# Patient Record
Sex: Female | Born: 1980 | Race: White | Hispanic: No | Marital: Married | Smoking: Current every day smoker
Health system: Southern US, Community
[De-identification: ages and names within clinical notes are randomized; demographics above are authoritative.]

## PROBLEM LIST (undated history)

## (undated) ENCOUNTER — Inpatient Hospital Stay (HOSPITAL_COMMUNITY): Payer: Self-pay

## (undated) DIAGNOSIS — O139 Gestational [pregnancy-induced] hypertension without significant proteinuria, unspecified trimester: Secondary | ICD-10-CM

## (undated) DIAGNOSIS — D219 Benign neoplasm of connective and other soft tissue, unspecified: Secondary | ICD-10-CM

## (undated) DIAGNOSIS — R400 Somnolence: Secondary | ICD-10-CM

## (undated) DIAGNOSIS — R5381 Other malaise: Secondary | ICD-10-CM

## (undated) DIAGNOSIS — G43909 Migraine, unspecified, not intractable, without status migrainosus: Secondary | ICD-10-CM

## (undated) HISTORY — DX: Somnolence: R40.0

## (undated) HISTORY — PX: CHOLECYSTECTOMY: SHX55

## (undated) HISTORY — DX: Other malaise: R53.81

## (undated) HISTORY — DX: Gestational (pregnancy-induced) hypertension without significant proteinuria, unspecified trimester: O13.9

## (undated) HISTORY — PX: WISDOM TOOTH EXTRACTION: SHX21

---
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
## 2013-12-18 NOTE — L&D Delivery Note (Signed)
Delivery Note At 6:37 PM a viable female was delivered via Vaginal, Spontaneous Delivery (Presentation: ;  ).  APGAR: , ; weight .   Placenta status: , .  Cord:  with the following complications: .  Cord pH: not done  Anesthesia:   Episiotomy:  Lacerations:  Suture Repair: 2.0 Est. Blood Loss (mL):   Mom to postpartum.  Baby to Couplet care / Skin to Skin.  Abdulahi Schor A 08/28/2014, 6:44 PM

## 2014-01-16 ENCOUNTER — Ambulatory Visit (INDEPENDENT_AMBULATORY_CARE_PROVIDER_SITE_OTHER): Payer: PRIVATE HEALTH INSURANCE | Admitting: Obstetrics & Gynecology

## 2014-01-16 ENCOUNTER — Encounter: Payer: Self-pay | Admitting: Obstetrics & Gynecology

## 2014-01-16 VITALS — BP 123/75 | Temp 98.5°F | Wt 201.0 lb

## 2014-01-16 DIAGNOSIS — Z3480 Encounter for supervision of other normal pregnancy, unspecified trimester: Secondary | ICD-10-CM | POA: Insufficient documentation

## 2014-01-16 DIAGNOSIS — Z3201 Encounter for pregnancy test, result positive: Secondary | ICD-10-CM

## 2014-01-16 DIAGNOSIS — N926 Irregular menstruation, unspecified: Secondary | ICD-10-CM

## 2014-01-16 LAB — POCT URINALYSIS DIPSTICK
Bilirubin, UA: NEGATIVE
Blood, UA: NEGATIVE
Glucose, UA: NEGATIVE
KETONES UA: NEGATIVE
Leukocytes, UA: NEGATIVE
Nitrite, UA: NEGATIVE
PH UA: 5
Protein, UA: NEGATIVE
SPEC GRAV UA: 1.015
Urobilinogen, UA: NEGATIVE

## 2014-01-16 LAB — POCT URINE PREGNANCY: Preg Test, Ur: NEGATIVE

## 2014-01-16 NOTE — Progress Notes (Signed)
Pulse 82 Subjective:    Annette Miranda is being seen today for her first obstetrical visit.  This is a planned pregnancy. She is at [redacted]w[redacted]d gestation. Her obstetrical history is significant for none. Relationship with FOB: spouse, living together. Patient unsure intend to breast feed. Pregnancy history fully reviewed.  Menstrual History: OB History   Grav Para Term Preterm Abortions TAB SAB Ect Mult Living   3 2 2       2       Menarche age: 33 Patient's last menstrual period was 12/06/2013.    The following portions of the patient's history were reviewed and updated as appropriate: allergies, current medications, past family history, past medical history, past social history, past surgical history and problem list.  Review of Systems Pertinent items are noted in HPI.    Objective:      General Appearance:    Alert, cooperative, no distress, appears stated age  Head:    Normocephalic, without obvious abnormality, atraumatic  Eyes:    PERRL, conjunctiva/corneas clear, EOM's intact, fundi    benign, both eyes  Ears:    Normal TM's and external ear canals, both ears  Nose:   Nares normal, septum midline, mucosa normal, no drainage    or sinus tenderness  Throat:   Lips, mucosa, and tongue normal; teeth and gums normal  Neck:   Supple, symmetrical, trachea midline, no adenopathy;    thyroid:  no enlargement/tenderness/nodules; no carotid   bruit or JVD  Back:     Symmetric, no curvature, ROM normal, no CVA tenderness  Lungs:     Clear to auscultation bilaterally, respirations unlabored  Chest Wall:    No tenderness or deformity   Heart:    Regular rate and rhythm, S1 and S2 normal, no murmur, rub   or gallop  Breast Exam:    No tenderness, masses, or nipple abnormality  Abdomen:     Soft, non-tender, bowel sounds active all four quadrants,    no masses, no organomegaly  Genitalia:    Normal female without lesion, discharge or tenderness  Extremities:   Extremities normal, atraumatic,  no cyanosis or edema  Pulses:   2+ and symmetric all extremities  Skin:   Skin color, texture, turgor normal, no rashes or lesions  Lymph nodes:   Cervical, supraclavicular, and axillary nodes normal  Neurologic:   CNII-XII intact, normal strength, sensation and reflexes    throughout        Assessment:    Pregnancy at [redacted]w[redacted]d weeks    Plan:    Initial labs drawn. Prenatal vitamins.  Counseling provided regarding continued use of seat belts, cessation of alcohol consumption, smoking or use of illicit drugs; infection precautions i.e., influenza/TDAP immunizations, toxoplasmosis,CMV, parvovirus, listeria and varicella; workplace safety, exercise during pregnancy; routine dental care, safe medications, sexual activity, hot tubs, saunas, pools, travel, caffeine use, fish and methlymercury, potential toxins, hair treatments, varicose veins Weight gain recommendations per IOM guidelines reviewed: overweight/BMI 25 - 29.9--> gain 15 - 25 lbs Problem list reviewed and updated. FIRST/CF mutation testing/NIPT discussed. Role of ultrasound in pregnancy discussed. Amniocentesis discussed: not indicated. Follow up in 6 weeks. 50% of 20 min visit spent on counseling and coordination of care.

## 2014-01-17 LAB — GC/CHLAMYDIA PROBE AMP
CT PROBE, AMP APTIMA: NEGATIVE
GC Probe RNA: NEGATIVE

## 2014-01-19 NOTE — Patient Instructions (Signed)
Pregnancy - First Trimester  During sexual intercourse, millions of sperm go into the vagina. Only 1 sperm will penetrate and fertilize the female egg while it is in the Fallopian tube. One week later, the fertilized egg implants into the wall of the uterus. An embryo begins to develop into a baby. At 6 to 8 weeks, the eyes and face are formed and the heartbeat can be seen on ultrasound. At the end of 12 weeks (first trimester), all the baby's organs are formed. Now that you are pregnant, you will want to do everything you can to have a healthy baby. Two of the most important things are to get good prenatal care and follow your caregiver's instructions. Prenatal care is all the medical care you receive before the baby's birth. It is given to prevent, find, and treat problems during the pregnancy and childbirth.  PRENATAL EXAMS  · During prenatal visits, your weight, blood pressure, and urine are checked. This is done to make sure you are healthy and progressing normally during the pregnancy.  · A pregnant woman should gain 25 to 35 pounds during the pregnancy. However, if you are overweight or underweight, your caregiver will advise you regarding your weight.  · Your caregiver will ask and answer questions for you.  · Blood work, cervical cultures, other necessary tests, and a Pap test are done during your prenatal exams. These tests are done to check on your health and the probable health of your baby. Tests are strongly recommended and done for HIV with your permission. This is the virus that causes AIDS. These tests are done because medicines can be given to help prevent your baby from being born with this infection should you have been infected without knowing it. Blood work is also used to find out your blood type, previous infections, and follow your blood levels (hemoglobin).  · Low hemoglobin (anemia) is common during pregnancy. Iron and vitamins are given to help prevent this. Later in the pregnancy, blood  tests for diabetes will be done along with any other tests if any problems develop.  · You may need other tests to make sure you and the baby are doing well.  CHANGES DURING THE FIRST TRIMESTER   Your body goes through many changes during pregnancy. They vary from person to person. Talk to your caregiver about changes you notice and are concerned about. Changes can include:  · Your menstrual period stops.  · The egg and sperm carry the genes that determine what you look like. Genes from you and your partner are forming a baby. The female genes determine whether the baby is a boy or a girl.  · Your body increases in girth and you may feel bloated.  · Feeling sick to your stomach (nauseous) and throwing up (vomiting). If the vomiting is uncontrollable, call your caregiver.  · Your breasts will begin to enlarge and become tender.  · Your nipples may stick out more and become darker.  · The need to urinate more. Painful urination may mean you have a bladder infection.  · Tiring easily.  · Loss of appetite.  · Cravings for certain kinds of food.  · At first, you may gain or lose a couple of pounds.  · You may have changes in your emotions from day to day (excited to be pregnant or concerned something may go wrong with the pregnancy and baby).  · You may have more vivid and strange dreams.  HOME CARE INSTRUCTIONS   ·   It is very important to avoid all smoking, alcohol and non-prescribed drugs during your pregnancy. These affect the formation and growth of the baby. Avoid chemicals while pregnant to ensure the delivery of a healthy infant.  · Start your prenatal visits by the 12th week of pregnancy. They are usually scheduled monthly at first, then more often in the last 2 months before delivery. Keep your caregiver's appointments. Follow your caregiver's instructions regarding medicine use, blood and lab tests, exercise, and diet.  · During pregnancy, you are providing food for you and your baby. Eat regular, well-balanced  meals. Choose foods such as meat, fish, milk and other low fat dairy products, vegetables, fruits, and whole-grain breads and cereals. Your caregiver will tell you of the ideal weight gain.  · You can help morning sickness by keeping soda crackers at the bedside. Eat a couple before arising in the morning. You may want to use the crackers without salt on them.  · Eating 4 to 5 small meals rather than 3 large meals a day also may help the nausea and vomiting.  · Drinking liquids between meals instead of during meals also seems to help nausea and vomiting.  · A physical sexual relationship may be continued throughout pregnancy if there are no other problems. Problems may be early (premature) leaking of amniotic fluid from the membranes, vaginal bleeding, or belly (abdominal) pain.  · Exercise regularly if there are no restrictions. Check with your caregiver or physical therapist if you are unsure of the safety of some of your exercises. Greater weight gain will occur in the last 2 trimesters of pregnancy. Exercising will help:  · Control your weight.  · Keep you in shape.  · Prepare you for labor and delivery.  · Help you lose your pregnancy weight after you deliver your baby.  · Wear a good support or jogging bra for breast tenderness during pregnancy. This may help if worn during sleep too.  · Ask when prenatal classes are available. Begin classes when they are offered.  · Do not use hot tubs, steam rooms, or saunas.  · Wear your seat belt when driving. This protects you and your baby if you are in an accident.  · Avoid raw meat, uncooked cheese, cat litter boxes, and soil used by cats throughout the pregnancy. These carry germs that can cause birth defects in the baby.  · The first trimester is a good time to visit your dentist for your dental health. Getting your teeth cleaned is okay. Use a softer toothbrush and brush gently during pregnancy.  · Ask for help if you have financial, counseling, or nutritional needs  during pregnancy. Your caregiver will be able to offer counseling for these needs as well as refer you for other special needs.  · Do not take any medicines or herbs unless told by your caregiver.  · Inform your caregiver if there is any mental or physical domestic violence.  · Make a list of emergency phone numbers of family, friends, hospital, and police and fire departments.  · Write down your questions. Take them to your prenatal visit.  · Do not douche.  · Do not cross your legs.  · If you have to stand for long periods of time, rotate you feet or take small steps in a circle.  · You may have more vaginal secretions that may require a sanitary pad. Do not use tampons or scented sanitary pads.  MEDICINES AND DRUG USE IN PREGNANCY  ·   Take prenatal vitamins as directed. The vitamin should contain 1 milligram of folic acid. Keep all vitamins out of reach of children. Only a couple vitamins or tablets containing iron may be fatal to a baby or young child when ingested.  · Avoid use of all medicines, including herbs, over-the-counter medicines, not prescribed or suggested by your caregiver. Only take over-the-counter or prescription medicines for pain, discomfort, or fever as directed by your caregiver. Do not use aspirin, ibuprofen, or naproxen unless directed by your caregiver.  · Let your caregiver also know about herbs you may be using.  · Alcohol is related to a number of birth defects. This includes fetal alcohol syndrome. All alcohol, in any form, should be avoided completely. Smoking will cause low birth rate and premature babies.  · Street or illegal drugs are very harmful to the baby. They are absolutely forbidden. A baby born to an addicted mother will be addicted at birth. The baby will go through the same withdrawal an adult does.  · Let your caregiver know about any medicines that you have to take and for what reason you take them.  SEEK MEDICAL CARE IF:   You have any concerns or worries during your  pregnancy. It is better to call with your questions if you feel they cannot wait, rather than worry about them.  SEEK IMMEDIATE MEDICAL CARE IF:   · An unexplained oral temperature above 102° F (38.9° C) develops, or as your caregiver suggests.  · You have leaking of fluid from the vagina (birth canal). If leaking membranes are suspected, take your temperature and inform your caregiver of this when you call.  · There is vaginal spotting or bleeding. Notify your caregiver of the amount and how many pads are used.  · You develop a bad smelling vaginal discharge with a change in the color.  · You continue to feel sick to your stomach (nauseated) and have no relief from remedies suggested. You vomit blood or coffee ground-like materials.  · You lose more than 2 pounds of weight in 1 week.  · You gain more than 2 pounds of weight in 1 week and you notice swelling of your face, hands, feet, or legs.  · You gain 5 pounds or more in 1 week (even if you do not have swelling of your hands, face, legs, or feet).  · You get exposed to German measles and have never had them.  · You are exposed to fifth disease or chickenpox.  · You develop belly (abdominal) pain. Round ligament discomfort is a common non-cancerous (benign) cause of abdominal pain in pregnancy. Your caregiver still must evaluate this.  · You develop headache, fever, diarrhea, pain with urination, or shortness of breath.  · You fall or are in a car accident or have any kind of trauma.  · There is mental or physical violence in your home.  Document Released: 11/28/2001 Document Revised: 08/28/2012 Document Reviewed: 06/01/2009  [employer]® Patient Information ©2014 [employer].

## 2014-02-24 ENCOUNTER — Ambulatory Visit (INDEPENDENT_AMBULATORY_CARE_PROVIDER_SITE_OTHER): Payer: PRIVATE HEALTH INSURANCE

## 2014-02-24 ENCOUNTER — Encounter: Payer: Self-pay | Admitting: Obstetrics & Gynecology

## 2014-02-24 ENCOUNTER — Other Ambulatory Visit: Payer: Self-pay | Admitting: Obstetrics & Gynecology

## 2014-02-24 DIAGNOSIS — O3660X0 Maternal care for excessive fetal growth, unspecified trimester, not applicable or unspecified: Secondary | ICD-10-CM

## 2014-02-24 DIAGNOSIS — O3680X0 Pregnancy with inconclusive fetal viability, not applicable or unspecified: Secondary | ICD-10-CM

## 2014-02-24 DIAGNOSIS — Z1389 Encounter for screening for other disorder: Secondary | ICD-10-CM

## 2014-02-24 LAB — US OB DETAIL + 14 WK

## 2014-02-26 ENCOUNTER — Encounter: Payer: PRIVATE HEALTH INSURANCE | Admitting: Obstetrics & Gynecology

## 2014-02-26 ENCOUNTER — Ambulatory Visit (INDEPENDENT_AMBULATORY_CARE_PROVIDER_SITE_OTHER): Payer: PRIVATE HEALTH INSURANCE | Admitting: Obstetrics & Gynecology

## 2014-02-26 ENCOUNTER — Encounter: Payer: PRIVATE HEALTH INSURANCE | Admitting: Advanced Practice Midwife

## 2014-02-26 ENCOUNTER — Encounter (HOSPITAL_COMMUNITY): Payer: Self-pay | Admitting: Obstetrics & Gynecology

## 2014-02-26 ENCOUNTER — Other Ambulatory Visit: Payer: Self-pay | Admitting: Obstetrics & Gynecology

## 2014-02-26 VITALS — BP 120/83 | Temp 98.6°F | Wt 221.0 lb

## 2014-02-26 DIAGNOSIS — R0989 Other specified symptoms and signs involving the circulatory and respiratory systems: Secondary | ICD-10-CM

## 2014-02-26 DIAGNOSIS — J329 Chronic sinusitis, unspecified: Secondary | ICD-10-CM

## 2014-02-26 DIAGNOSIS — Z3682 Encounter for antenatal screening for nuchal translucency: Secondary | ICD-10-CM

## 2014-02-26 DIAGNOSIS — Z3480 Encounter for supervision of other normal pregnancy, unspecified trimester: Secondary | ICD-10-CM

## 2014-02-26 LAB — POCT URINALYSIS DIPSTICK
BILIRUBIN UA: NEGATIVE
Glucose, UA: NEGATIVE
Ketones, UA: NEGATIVE
Leukocytes, UA: NEGATIVE
Nitrite, UA: NEGATIVE
PH UA: 5
RBC UA: NEGATIVE
Spec Grav, UA: >=1.03
Urobilinogen, UA: NEGATIVE

## 2014-02-26 MED ORDER — BUDESONIDE 32 MCG/ACT NA SUSP: 1.0000 | Freq: Every day | NASAL | Status: DC

## 2014-02-26 NOTE — Progress Notes (Signed)
Pulse: 84 Patient states she is currently taking a prenatal vitamin but doesn't know the name of it. Patient would like to talk about prenatal vitamins today. Patient states she had some bright red mucous like discharge 3 weeks ago. Patient states she has been having really bad sinuses and getting out of breath really easily. Patient states she is having a lot of sinus pressure and is getting headaches from it.  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

## 2014-02-26 NOTE — Patient Instructions (Signed)
Second Trimester of Pregnancy The second trimester is from week 13 through week 28, months 4 through 6. The second trimester is often a time when you feel your best. Your body has also adjusted to being pregnant, and you begin to feel better physically. Usually, morning sickness has lessened or quit completely, you may have more energy, and you may have an increase in appetite. The second trimester is also a time when the fetus is growing rapidly. At the end of the sixth month, the fetus is about 9 inches long and weighs about 1 pounds. You will likely begin to feel the baby move (quickening) between 18 and 20 weeks of the pregnancy. BODY CHANGES Your body goes through many changes during pregnancy. The changes vary from woman to woman.   Your weight will continue to increase. You will notice your lower abdomen bulging out.  You may begin to get stretch marks on your hips, abdomen, and breasts.  You may develop headaches that can be relieved by medicines approved by your caregiver.  You may urinate more often because the fetus is pressing on your bladder.  You may develop or continue to have heartburn as a result of your pregnancy.  You may develop constipation because certain hormones are causing the muscles that push waste through your intestines to slow down.  You may develop hemorrhoids or swollen, bulging veins (varicose veins).  You may have back pain because of the weight gain and pregnancy hormones relaxing your joints between the bones in your pelvis and as a result of a shift in weight and the muscles that support your balance.  Your breasts will continue to grow and be tender.  Your gums may bleed and may be sensitive to brushing and flossing.  Dark spots or blotches (chloasma, mask of pregnancy) may develop on your face. This will likely fade after the baby is born.  A dark line from your belly button to the pubic area (linea nigra) may appear. This will likely fade after the  baby is born. WHAT TO EXPECT AT YOUR PRENATAL VISITS During a routine prenatal visit:  You will be weighed to make sure you and the fetus are growing normally.  Your blood pressure will be taken.  Your abdomen will be measured to track your baby's growth.  The fetal heartbeat will be listened to.  Any test results from the previous visit will be discussed. Your caregiver may ask you:  How you are feeling.  If you are feeling the baby move.  If you have had any abnormal symptoms, such as leaking fluid, bleeding, severe headaches, or abdominal cramping.  If you have any questions. Other tests that may be performed during your second trimester include:  Blood tests that check for:  Low iron levels (anemia).  Gestational diabetes (between 24 and 28 weeks).  Rh antibodies.  Urine tests to check for infections, diabetes, or protein in the urine.  An ultrasound to confirm the proper growth and development of the baby.  An amniocentesis to check for possible genetic problems.  Fetal screens for spina bifida and Down syndrome. HOME CARE INSTRUCTIONS   Avoid all smoking, herbs, alcohol, and unprescribed drugs. These chemicals affect the formation and growth of the baby.  Follow your caregiver's instructions regarding medicine use. There are medicines that are either safe or unsafe to take during pregnancy.  Exercise only as directed by your caregiver. Experiencing uterine cramps is a good sign to stop exercising.  Continue to eat regular,   healthy meals.  Wear a good support bra for breast tenderness.  Do not use hot tubs, steam rooms, or saunas.  Wear your seat belt at all times when driving.  Avoid raw meat, uncooked cheese, cat litter boxes, and soil used by cats. These carry germs that can cause birth defects in the baby.  Take your prenatal vitamins.  Try taking a stool softener (if your caregiver approves) if you develop constipation. Eat more high-fiber foods,  such as fresh vegetables or fruit and whole grains. Drink plenty of fluids to keep your urine clear or pale yellow.  Take warm sitz baths to soothe any pain or discomfort caused by hemorrhoids. Use hemorrhoid cream if your caregiver approves.  If you develop varicose veins, wear support hose. Elevate your feet for 15 minutes, 3 4 times a day. Limit salt in your diet.  Avoid heavy lifting, wear low heel shoes, and practice good posture.  Rest with your legs elevated if you have leg cramps or low back pain.  Visit your dentist if you have not gone yet during your pregnancy. Use a soft toothbrush to brush your teeth and be gentle when you floss.  A sexual relationship may be continued unless your caregiver directs you otherwise.  Continue to go to all your prenatal visits as directed by your caregiver. SEEK MEDICAL CARE IF:   You have dizziness.  You have mild pelvic cramps, pelvic pressure, or nagging pain in the abdominal area.  You have persistent nausea, vomiting, or diarrhea.  You have a bad smelling vaginal discharge.  You have pain with urination. SEEK IMMEDIATE MEDICAL CARE IF:   You have a fever.  You are leaking fluid from your vagina.  You have spotting or bleeding from your vagina.  You have severe abdominal cramping or pain.  You have rapid weight gain or loss.  You have shortness of breath with chest pain.  You notice sudden or extreme swelling of your face, hands, ankles, feet, or legs.  You have not felt your baby move in over an hour.  You have severe headaches that do not go away with medicine.  You have vision changes. Document Released: 11/28/2001 Document Revised: 08/06/2013 Document Reviewed: 02/04/2013 [employer] Patient Information 2014 [employer].  

## 2014-02-27 LAB — OBSTETRIC PANEL
ANTIBODY SCREEN: NEGATIVE
Basophils Absolute: 0 10*3/uL (ref 0.0–0.1)
Basophils Relative: 0 % (ref 0–1)
EOS ABS: 0.1 10*3/uL (ref 0.0–0.7)
EOS PCT: 1 % (ref 0–5)
HCT: 39.7 % (ref 36.0–46.0)
HEMOGLOBIN: 13.6 g/dL (ref 12.0–15.0)
Hepatitis B Surface Ag: NEGATIVE
LYMPHS ABS: 1.8 10*3/uL (ref 0.7–4.0)
Lymphocytes Relative: 21 % (ref 12–46)
MCH: 31.1 pg (ref 26.0–34.0)
MCHC: 34.3 g/dL (ref 30.0–36.0)
MCV: 90.6 fL (ref 78.0–100.0)
MONOS PCT: 5 % (ref 3–12)
Monocytes Absolute: 0.4 10*3/uL (ref 0.1–1.0)
Neutro Abs: 6.1 10*3/uL (ref 1.7–7.7)
Neutrophils Relative %: 73 % (ref 43–77)
Platelets: 200 10*3/uL (ref 150–400)
RBC: 4.38 MIL/uL (ref 3.87–5.11)
RDW: 13.6 % (ref 11.5–15.5)
RH TYPE: POSITIVE
Rubella: 1.24 Index — ABNORMAL HIGH (ref <0.90)
WBC: 8.4 10*3/uL (ref 4.0–10.5)

## 2014-02-27 LAB — CULTURE, OB URINE: Colony Count: 60000

## 2014-02-27 LAB — HIV ANTIBODY (ROUTINE TESTING W REFLEX): HIV: NONREACTIVE

## 2014-02-27 LAB — VARICELLA ZOSTER ANTIBODY, IGG: VARICELLA IGG: 722.6 {index} — AB (ref <135.00)

## 2014-02-27 LAB — VITAMIN D 25 HYDROXY (VIT D DEFICIENCY, FRACTURES): VIT D 25 HYDROXY: 40 ng/mL (ref 30–89)

## 2014-03-02 LAB — HEMOGLOBINOPATHY EVALUATION
HEMOGLOBIN OTHER: 0 %
Hgb A2 Quant: 2.7 % (ref 2.2–3.2)
Hgb A: 97.3 % (ref 96.8–97.8)
Hgb F Quant: 0 % (ref 0.0–2.0)
Hgb S Quant: 0 %

## 2014-03-04 ENCOUNTER — Encounter: Payer: Self-pay | Admitting: Obstetrics & Gynecology

## 2014-03-04 ENCOUNTER — Encounter (HOSPITAL_COMMUNITY): Payer: Self-pay

## 2014-03-04 ENCOUNTER — Ambulatory Visit (HOSPITAL_COMMUNITY)
Admission: RE | Admit: 2014-03-04 | Discharge: 2014-03-04 | Disposition: A | Discharge status: Home / Self Care | Payer: PRIVATE HEALTH INSURANCE | Source: Ambulatory Visit | Attending: Obstetrics & Gynecology | Admitting: Obstetrics & Gynecology

## 2014-03-04 DIAGNOSIS — Z3682 Encounter for antenatal screening for nuchal translucency: Secondary | ICD-10-CM

## 2014-03-04 DIAGNOSIS — Z3689 Encounter for other specified antenatal screening: Secondary | ICD-10-CM | POA: Insufficient documentation

## 2014-03-04 DIAGNOSIS — O3510X0 Maternal care for (suspected) chromosomal abnormality in fetus, unspecified, not applicable or unspecified: Secondary | ICD-10-CM | POA: Insufficient documentation

## 2014-03-04 DIAGNOSIS — O351XX0 Maternal care for (suspected) chromosomal abnormality in fetus, not applicable or unspecified: Secondary | ICD-10-CM | POA: Insufficient documentation

## 2014-03-04 IMAGING — US US MFM FETAL NUCHAL TRANSLUCENCY
1 series · 13 of 25 positions shown · non-contrast
Comparison: none

[Series 1: us mfm fetal nuchal translucency · 0.20mm/px · 13 of 25 slices shown]
[im 1/25]
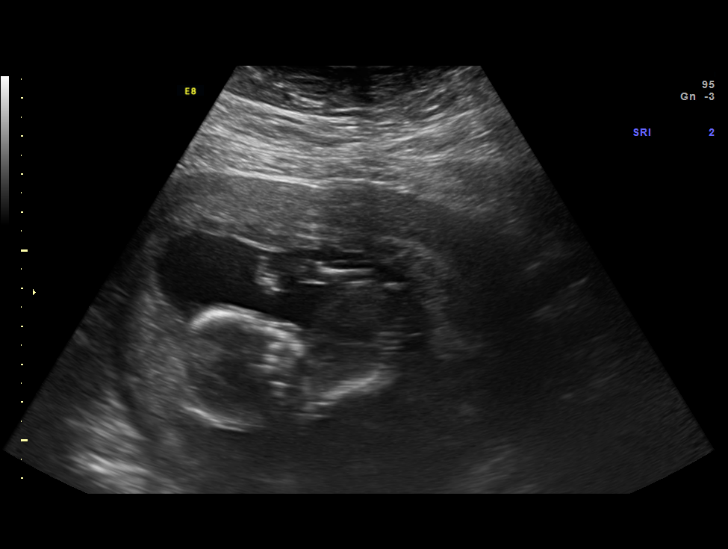
[im 3/25]
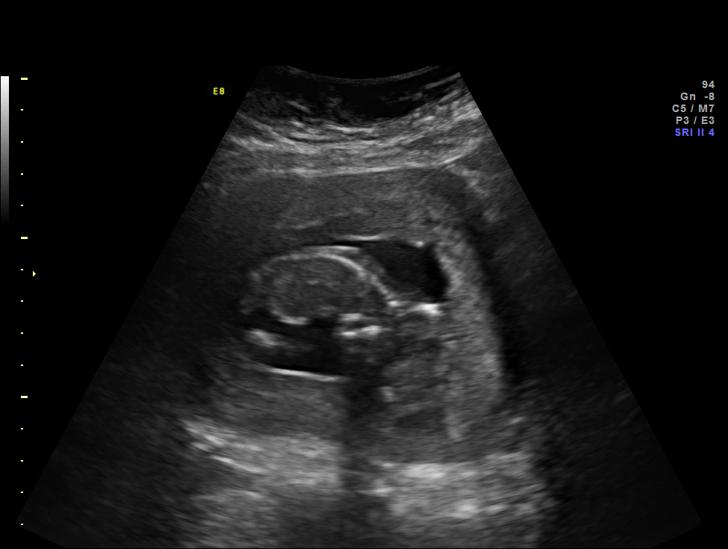
[im 5/25]
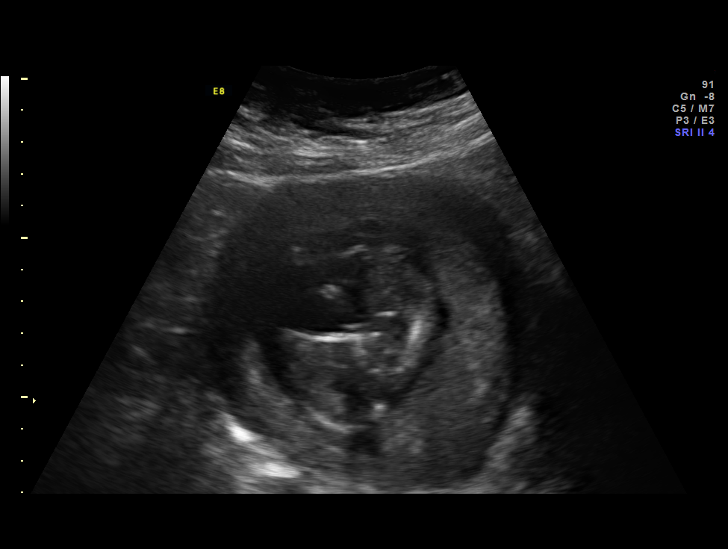
[im 7/25]
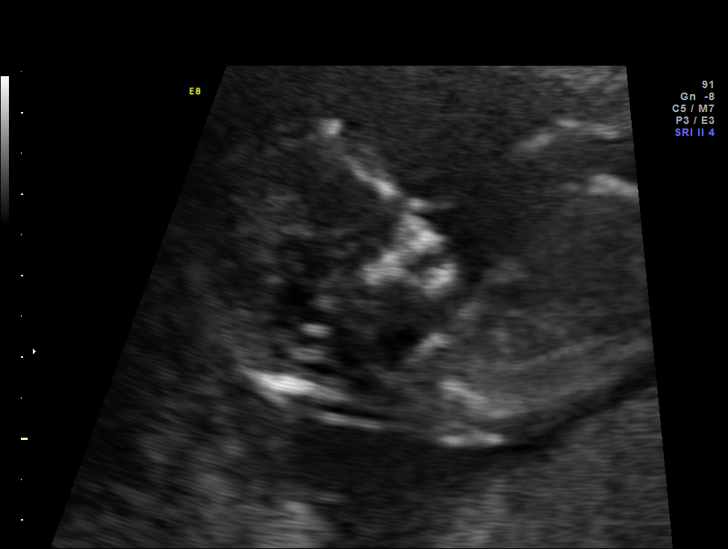
[im 9/25]
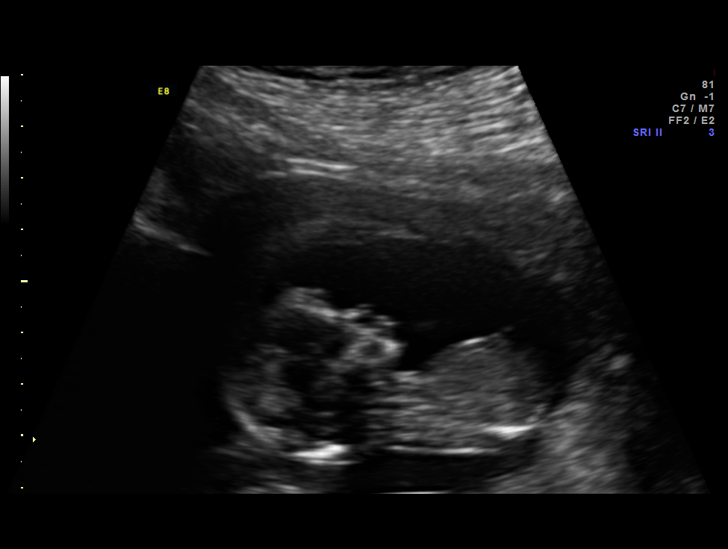
[im 11/25]
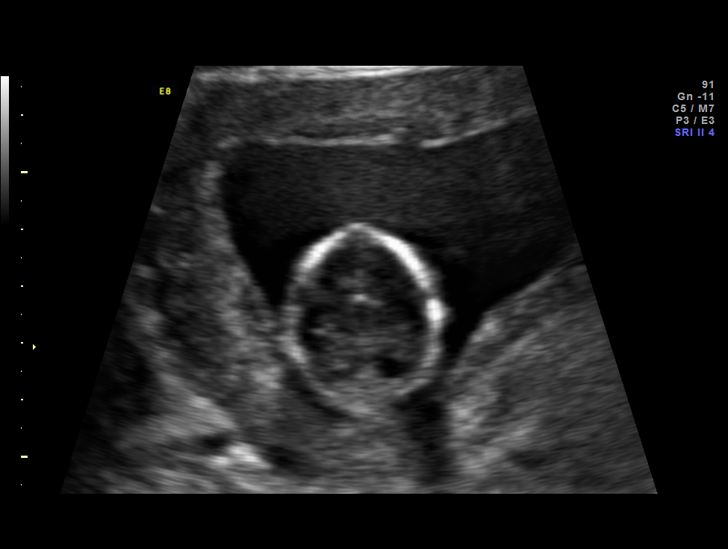
[im 13/25]
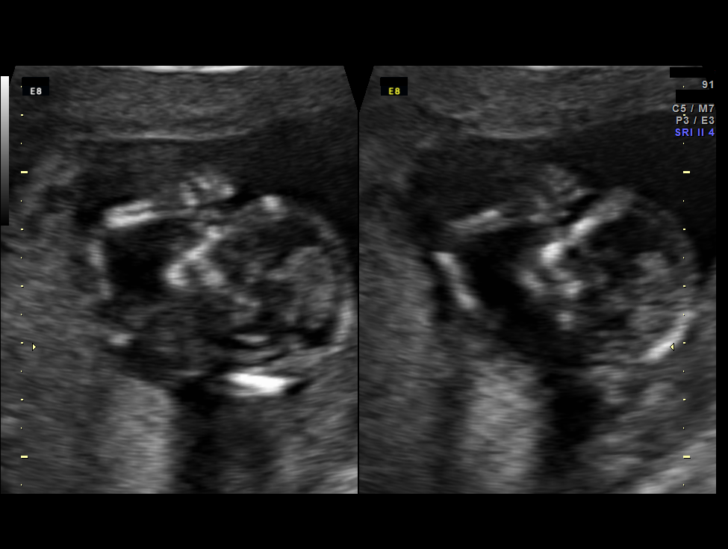
[im 15/25]
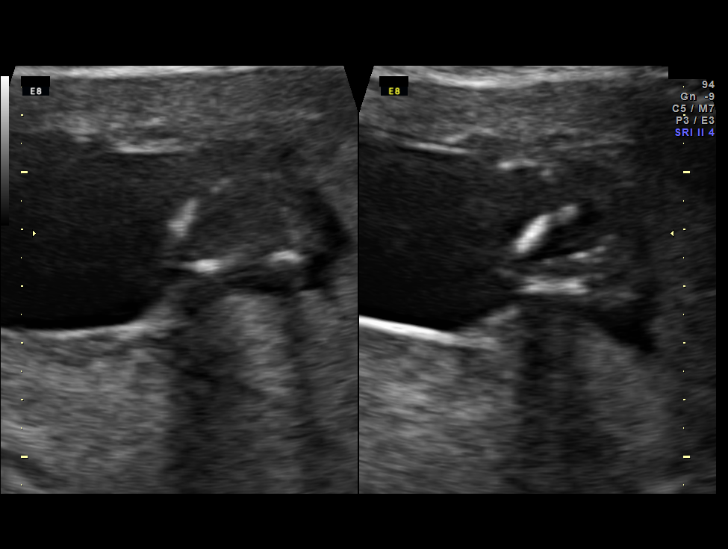
[im 17/25]
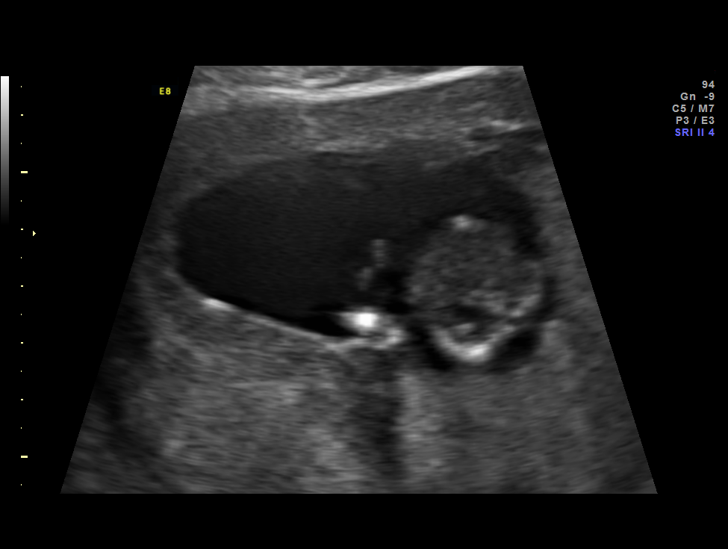
[im 19/25]
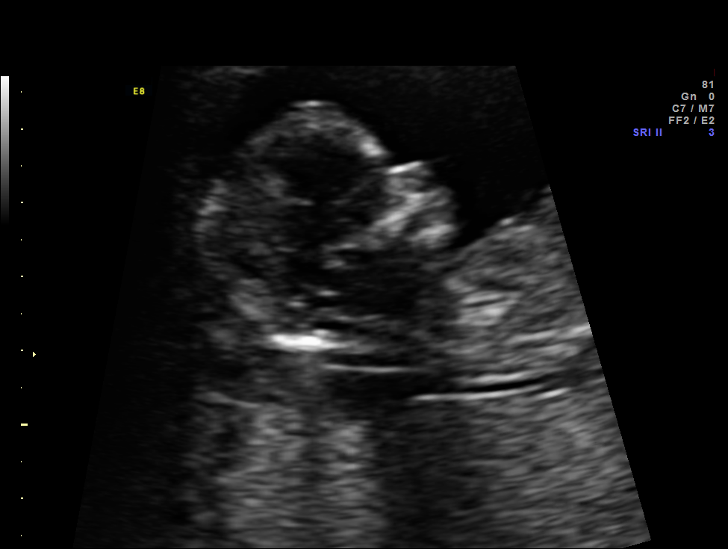
[im 21/25]
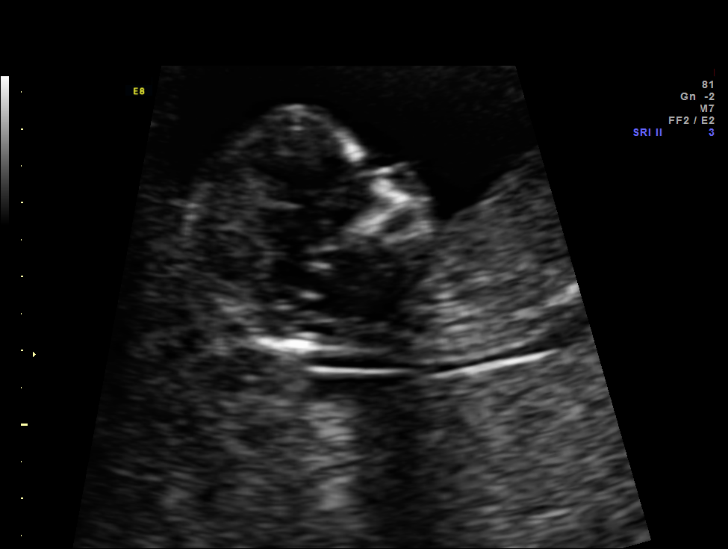
[im 23/25]
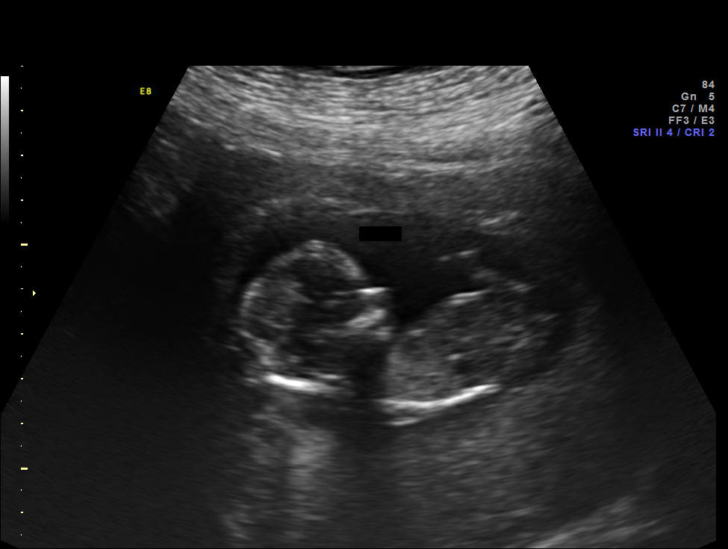
[im 25/25]
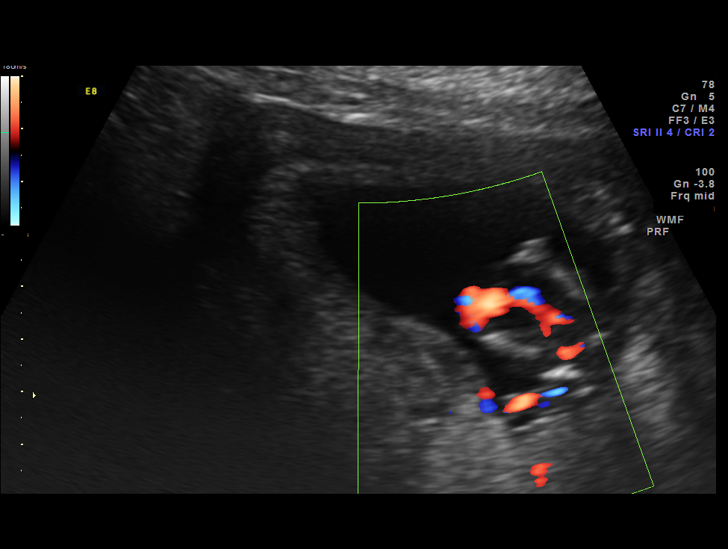

[13 of 25 positions shown; findings below may reference images not displayed]

OBSTETRICS REPORT
                      (Signed Final 03/04/2014 [DATE])

Service(s) Provided

Indications

 First trimester aneuploidy screen (NT)
Fetal Evaluation

 Num Of Fetuses:    1
 Fetal Heart Rate:  161                          bpm
 Cardiac Activity:  Observed
 Presentation:      Transverse, head to
                    maternal right

 Amniotic Fluid
 AFI FV:      Subjectively within normal limits
Biometry

 CRL:     72.5  mm     G. Age:  13w 1d                 EDD:    09/08/14

 NT:       2.2  mm
Gestational Age

 Best:          13w 5d     Det. By:  Early Ultrasound         EDD:   09/04/14
                                     (02/24/14)
Anatomy

 Choroid Plexus:   Appears normal         Lower             Visualized
                                          Extremities:
 Stomach:          Appears normal, left   Upper             Visualized
                   sided                  Extremities:
 Cord Vessels:     Appears normal (3
                   vessel cord)
Cervix Uterus Adnexa

 Cervix:       Normal appearance by transabdominal scan.
Impression

 Single IuP at 13 [DATE] weeks
 NT 1.2 mm; Nasal bone visualized
 First trimester aneuploidy screen performed as noted above.
Recommendations

 Please do not draw triple/quad screen, though patient should
 be offered MSAFP for neural tube defect screening.
 Recommend ultrasound for anatomy at 18-20 weeks.

 questions or concerns.

## 2014-03-07 ENCOUNTER — Encounter: Payer: Self-pay | Admitting: Obstetrics & Gynecology

## 2014-03-11 ENCOUNTER — Encounter: Payer: Self-pay | Admitting: Obstetrics & Gynecology

## 2014-03-30 ENCOUNTER — Encounter: Payer: PRIVATE HEALTH INSURANCE | Admitting: Obstetrics & Gynecology

## 2014-04-13 ENCOUNTER — Encounter: Payer: Self-pay | Admitting: Obstetrics & Gynecology

## 2014-04-13 ENCOUNTER — Ambulatory Visit (INDEPENDENT_AMBULATORY_CARE_PROVIDER_SITE_OTHER): Payer: PRIVATE HEALTH INSURANCE | Admitting: Obstetrics & Gynecology

## 2014-04-13 VITALS — BP 131/79 | HR 92 | Temp 98.1°F | Wt 238.0 lb

## 2014-04-13 DIAGNOSIS — Z3480 Encounter for supervision of other normal pregnancy, unspecified trimester: Secondary | ICD-10-CM

## 2014-04-13 DIAGNOSIS — O99330 Smoking (tobacco) complicating pregnancy, unspecified trimester: Secondary | ICD-10-CM

## 2014-04-13 DIAGNOSIS — IMO0002 Reserved for concepts with insufficient information to code with codable children: Secondary | ICD-10-CM

## 2014-04-13 LAB — POCT URINALYSIS DIPSTICK
Bilirubin, UA: NEGATIVE
Blood, UA: NEGATIVE
KETONES UA: NEGATIVE
LEUKOCYTES UA: NEGATIVE
Nitrite, UA: NEGATIVE
Protein, UA: NEGATIVE
SPEC GRAV UA: 1.02
Urobilinogen, UA: NEGATIVE
pH, UA: 5

## 2014-04-13 MED ORDER — CITRANATAL HARMONY 27-1-250 MG PO CAPS: 1.0000 | ORAL_CAPSULE | Freq: Every day | ORAL | Status: DC

## 2014-04-13 NOTE — Patient Instructions (Signed)
Alpha-Fetoprotein Alpha-Fetoprotein (AFP) is a protein made by the fetal liver. It is normally elevated in the newborn and the mother. In the infant, the value falls to adult values (normally less than 20 ng/ml (nanograms per milliliter) by one year of age. This protein is found in a number of abnormal tumors and conditions. It can be used for a screening test when this is appropriate. PREPARATION FOR TEST No preparation or fasting is required. ELEVATIONS OF AFP ARE CAUSED BY:  Primary hepatocellular carcinoma (cancer of the liver) and the level correlates with tumor size.  A screening test for embryonic teratocarcinomas, hepatoblastomas (tumor of the liver).  Rarely hepatic metastases (cancer spread) from the GI tract.  Some cholangiocarcinomas cause elevations greater than 400 ng/ml.  Rapidly progressing hepatitis can produce levels of AFP in excess of 1000ng/ml.  Lesser levels of hepatitis (inflammation of the liver) can produce levels of 100 to 400 ng/ml. NORMAL FINDINGS   Adult: Less than 40 ng/mL or Less than 40 mg/L (SI units).  Child younger than 1 year: Less than 30ng/mL. Ranges are stratified by weeks of gestation and vary among laboratories. Ranges for normal findings may vary among different laboratories and hospitals. You should always check with your doctor after having lab work or other tests done to discuss the meaning of your test results and whether your values are considered within normal limits. MEANING OF TEST  Your caregiver will go over the test results with you and discuss the importance and meaning of your results, as well as treatment options and the need for additional tests if necessary. OBTAINING THE TEST RESULTS  It is your responsibility to obtain your test results. Ask the lab or department performing the test when and how you will get your results. Document Released: 12/28/2004 Document Revised: 02/26/2012 Document Reviewed: 11/08/2008 [facility] Patient  Information 2014 [place], [place].

## 2014-04-13 NOTE — Progress Notes (Signed)
Subjective:    Annette Miranda is a 33 y.o. female being seen today for her obstetrical visit. She is at [redacted]w[redacted]d gestation. Patient reports: nausea and vomiting . Fetal movement: normal.  Problem List Items Addressed This Visit   Supervision of other normal pregnancy - Primary   Relevant Medications      Prenat w/o A-FeCbn-DSS-FA-DHA (CITRANATAL HARMONY) 27-1-250 MG CAPS   Other Relevant Orders      POCT urinalysis dipstick (Completed)      Alpha fetoprotein, maternal   Tobacco smoking complicating pregnancy     Patient Active Problem List   Diagnosis Date Noted  . Tobacco smoking complicating pregnancy 64/40/3474  . 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 Supervision of other normal pregnancy 01/16/2014   Objective:    BP 131/79  Pulse 92  Temp(Src) 98.1 F (36.7 C)  Wt 107.956 kg (238 lb)  LMP 12/06/2013 FHT: 140 BPM  Uterine Size: @ Umbilicus     Assessment:    Pregnancy @ [redacted]w[redacted]d   Elevated B/P today Plan:  Recheck B/P--> 131/79 Orders Placed This Encounter  Procedures  . Alpha fetoprotein, maternal    Order Specific Question:  Gest Age at U/S (Wk.Dy)    Answer:  13.1    Order Specific Question:  Maternal Weight (lbs)    Answer:  5    Order Specific Question:  Maternal Race    Answer:  caucasian    Order Specific Question:  Maternal IDDM (insulin-dependent diabetes mellitus)    Answer:  No    Order Specific Question:  Number of Fetuses    Answer:  1    Order Specific Question:  Date of LMP?    Answer:  12/06/2013    Order Specific Question:  Ultrasound Date    Answer:  03/04/2014    Order Specific Question:  Hx of OSB/NTD?    Answer:  No    Order Specific Question:  EDD    Answer:  09/04/2014    Order Specific Question:  Repeat Sample    Answer:  No    Order Specific Question:  Brief History NTD    Answer:  none    Order Specific Question:  History of Down Syndrome?    Answer:  No    Order Specific Question:  Pregnancy Donor Egg (Y/N)    Answer:  No  .  POCT urinalysis dipstick  U/S for anatomy Labs, problem list reviewed and updated Follow up in 4 weeks.

## 2014-04-14 LAB — ALPHA FETOPROTEIN, MATERNAL
AFP: 49 IU/mL
Curr Gest Age: 18.6 wks.days
MOM FOR AFP: 1.51
Open Spina bifida: NEGATIVE

## 2014-04-21 ENCOUNTER — Other Ambulatory Visit: Payer: Self-pay | Admitting: Obstetrics & Gynecology

## 2014-04-21 ENCOUNTER — Ambulatory Visit (INDEPENDENT_AMBULATORY_CARE_PROVIDER_SITE_OTHER): Payer: PRIVATE HEALTH INSURANCE

## 2014-04-21 DIAGNOSIS — Z1389 Encounter for screening for other disorder: Secondary | ICD-10-CM

## 2014-04-22 ENCOUNTER — Encounter: Payer: Self-pay | Admitting: Obstetrics & Gynecology

## 2014-04-22 LAB — US OB LIMITED

## 2014-04-23 ENCOUNTER — Encounter: Payer: Self-pay | Admitting: Obstetrics & Gynecology

## 2014-05-06 ENCOUNTER — Inpatient Hospital Stay (HOSPITAL_COMMUNITY)
Admission: AD | Admit: 2014-05-06 | Discharge: 2014-05-06 | Disposition: A | Discharge status: Home / Self Care | Payer: PRIVATE HEALTH INSURANCE | Source: Ambulatory Visit | Attending: Obstetrics & Gynecology | Admitting: Obstetrics & Gynecology

## 2014-05-06 ENCOUNTER — Inpatient Hospital Stay (HOSPITAL_COMMUNITY): Payer: PRIVATE HEALTH INSURANCE

## 2014-05-06 ENCOUNTER — Encounter (HOSPITAL_COMMUNITY): Payer: Self-pay

## 2014-05-06 DIAGNOSIS — M545 Low back pain, unspecified: Secondary | ICD-10-CM | POA: Insufficient documentation

## 2014-05-06 DIAGNOSIS — O209 Hemorrhage in early pregnancy, unspecified: Secondary | ICD-10-CM | POA: Insufficient documentation

## 2014-05-06 DIAGNOSIS — O4692 Antepartum hemorrhage, unspecified, second trimester: Secondary | ICD-10-CM

## 2014-05-06 DIAGNOSIS — IMO0002 Reserved for concepts with insufficient information to code with codable children: Secondary | ICD-10-CM

## 2014-05-06 DIAGNOSIS — Z87891 Personal history of nicotine dependence: Secondary | ICD-10-CM | POA: Insufficient documentation

## 2014-05-06 LAB — OB RESULTS CONSOLE GC/CHLAMYDIA
CHLAMYDIA, DNA PROBE: NEGATIVE
Gonorrhea: NEGATIVE

## 2014-05-06 LAB — URINALYSIS, ROUTINE W REFLEX MICROSCOPIC
Bilirubin Urine: NEGATIVE
GLUCOSE, UA: NEGATIVE mg/dL
Ketones, ur: 15 mg/dL — AB
Leukocytes, UA: NEGATIVE
Nitrite: NEGATIVE
PH: 6 (ref 5.0–8.0)
Protein, ur: NEGATIVE mg/dL
Specific Gravity, Urine: >1.03 — ABNORMAL HIGH (ref 1.005–1.030)
Urobilinogen, UA: 0.2 mg/dL (ref 0.0–1.0)

## 2014-05-06 LAB — WET PREP, GENITAL
CLUE CELLS WET PREP: NONE SEEN
Trich, Wet Prep: NONE SEEN
Yeast Wet Prep HPF POC: NONE SEEN

## 2014-05-06 LAB — CBC
HCT: 35.6 % — ABNORMAL LOW (ref 36.0–46.0)
Hemoglobin: 12.6 g/dL (ref 12.0–15.0)
MCH: 32.7 pg (ref 26.0–34.0)
MCHC: 35.4 g/dL (ref 30.0–36.0)
MCV: 92.5 fL (ref 78.0–100.0)
Platelets: 162 10*3/uL (ref 150–400)
RBC: 3.85 MIL/uL — ABNORMAL LOW (ref 3.87–5.11)
RDW: 13 % (ref 11.5–15.5)
WBC: 10 10*3/uL (ref 4.0–10.5)

## 2014-05-06 LAB — URINE MICROSCOPIC-ADD ON

## 2014-05-06 IMAGING — US US OB LIMITED
1 series · 13 of 27 positions shown · non-contrast
Comparison: none

[Series 1: us ob transvaginal · 27 acquisitions, 13 frames shown]
[im 2/27]
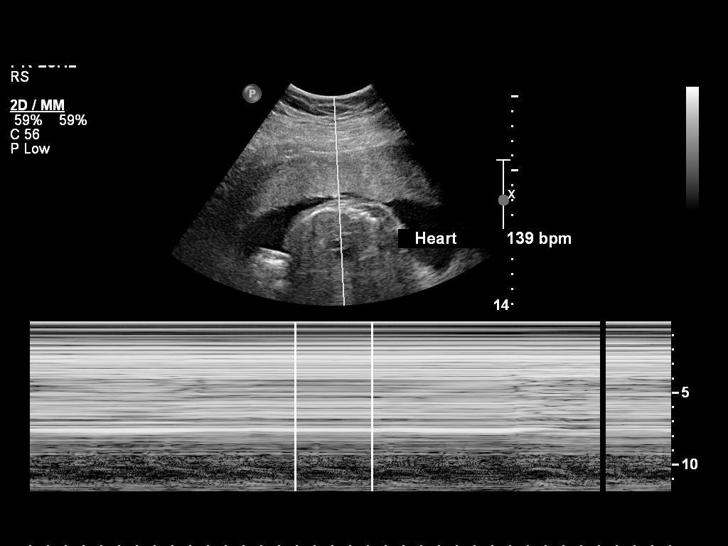
[im 4/27]
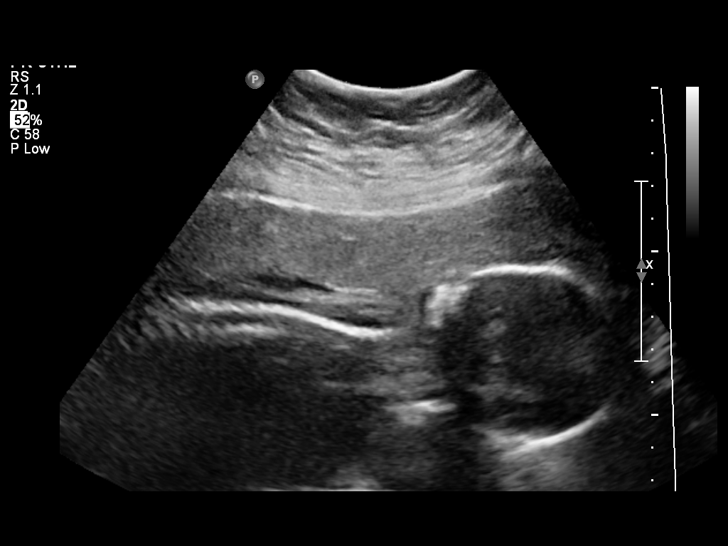
[im 6/27]
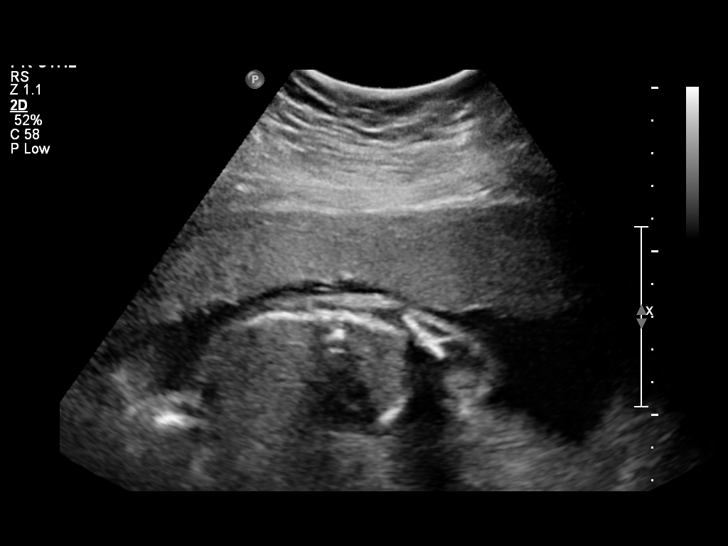
[im 8/27]
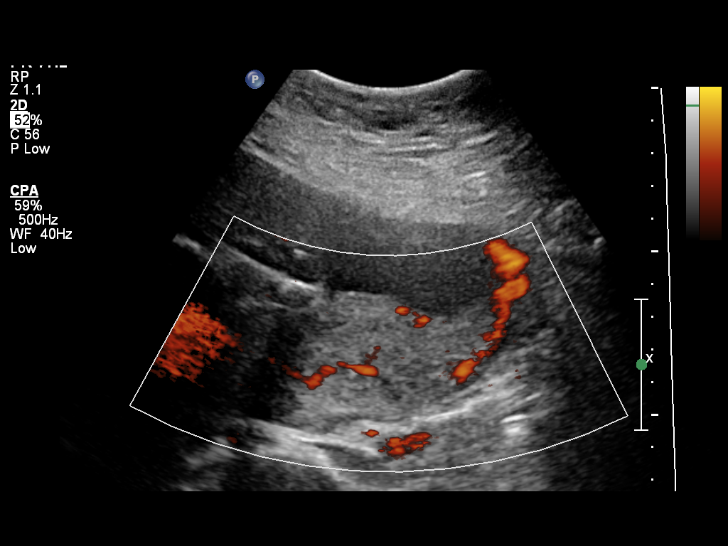
[im 10/27]
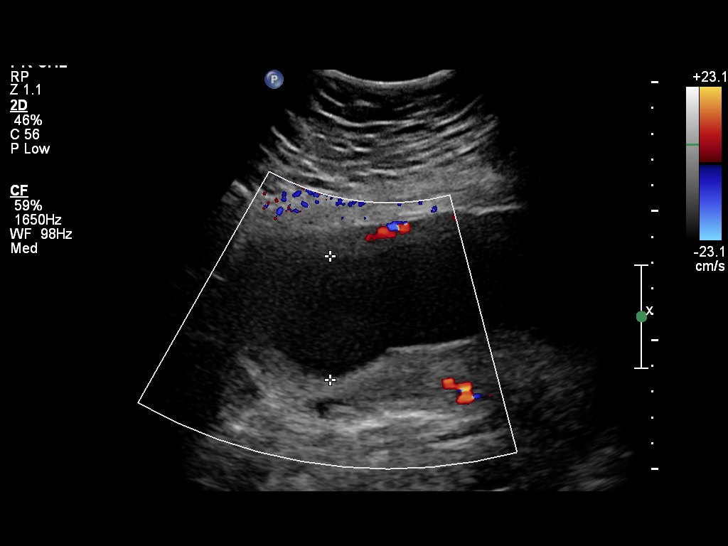
[im 12/27]
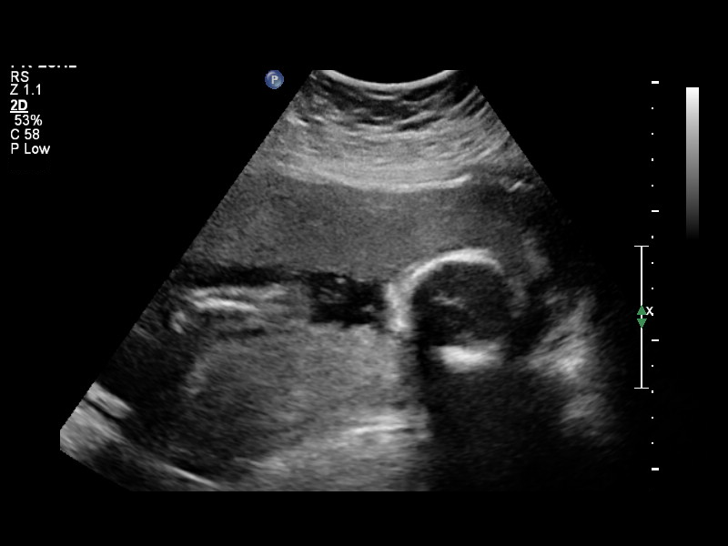
[im 14/27]
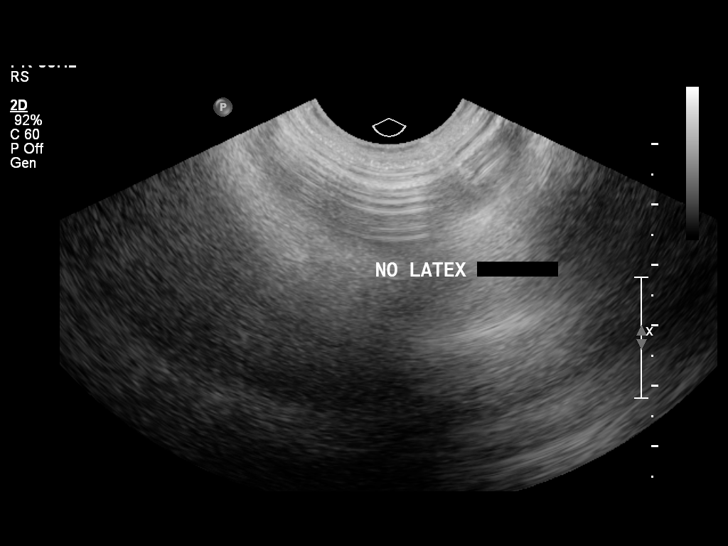
[im 16/27]
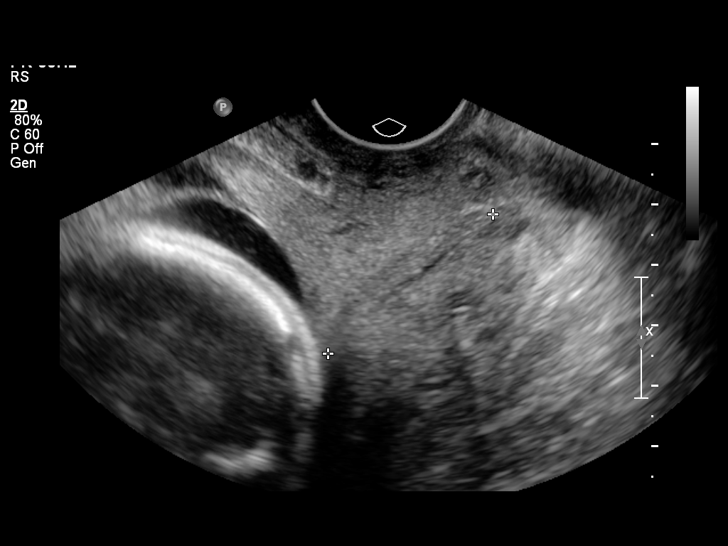
[im 18/27]
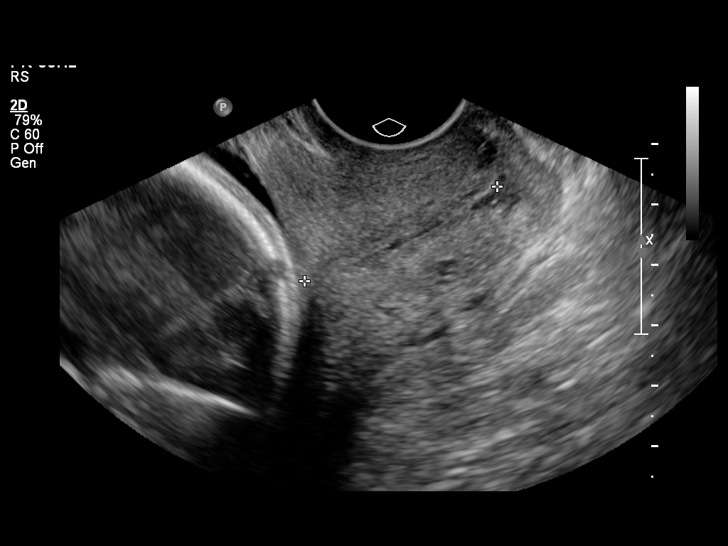
[im 20/27]
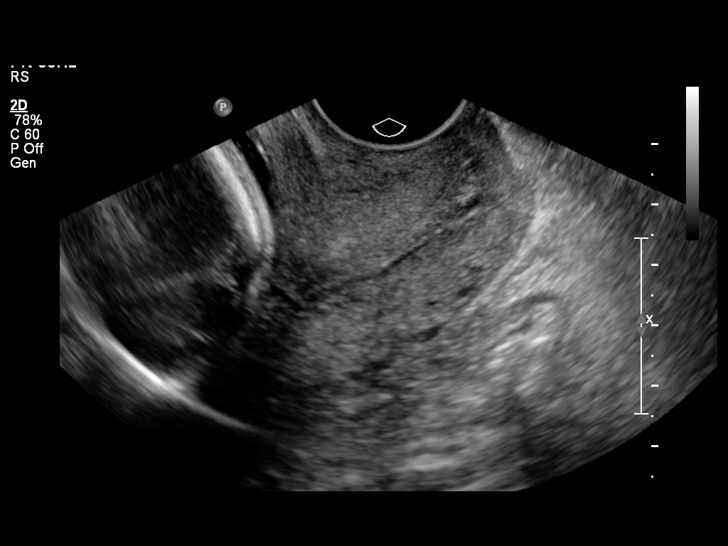
[im 22/27]
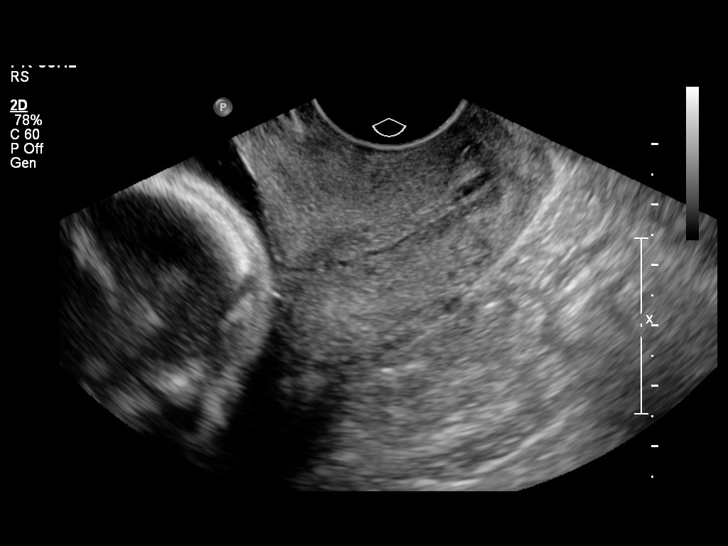
[im 24/27]
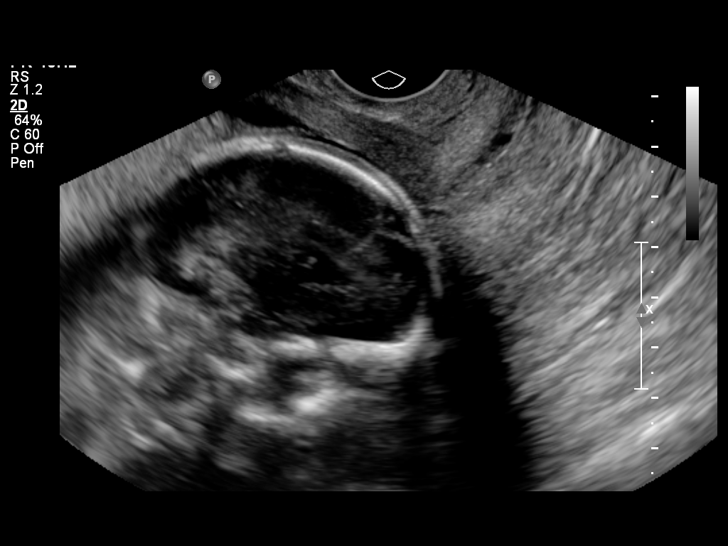
[im 26/27]
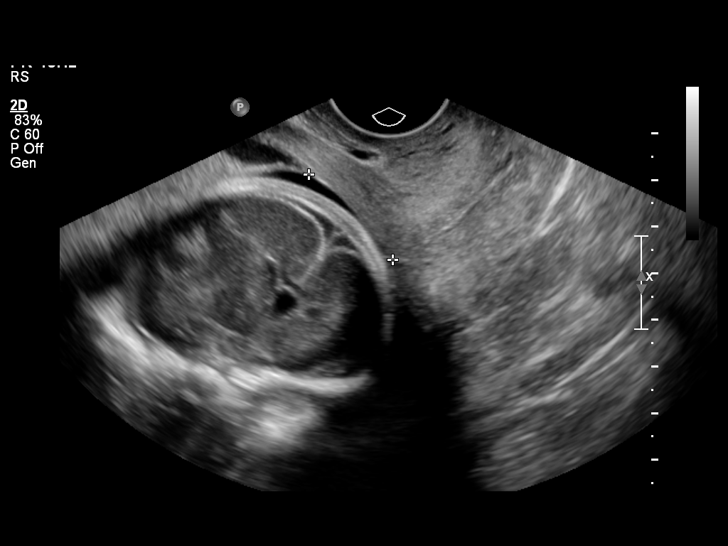

[13 of 27 positions shown; findings below may reference images not displayed]

OBSTETRICS REPORT
                      (Signed Final 05/06/2014 [DATE])

Service(s) Provided

 [HOSPITAL]                                         76815.0
 US MFM OB TRANSVAGINAL                                76817.2
Indications

 Vaginal bleeding, unknown etiology
 Assess cervical length
 Assess amniotic fluid volume
Fetal Evaluation

 Num Of Fetuses:    1
 Fetal Heart Rate:  139                          bpm
 Cardiac Activity:  Observed
 Presentation:      Cephalic
 Placenta:          Anterior ? succienturiate
                    lobe

 Amniotic Fluid
 AFI FV:      Subjectively low-normal
                                             Larg Pckt:     4.8  cm
Gestational Age

 Best:          22w 5d     Det. By:  Early Ultrasound         EDD:   09/04/14
                                     (02/24/14)
Cervix Uterus Adnexa

 Cervical Length:    3.6      cm

 Cervix:       Normal appearance by transvaginal scan
Impression

 Single IUP at 22w 5d
 Limited ultrasound performed due to vaginal bleeding
 An anterior placenta was noted.  On some images, there
 appears to be a succenturiate lobe, but it does not abut the
 cervix
 No subchorionic fluid collections noted

 TVUS - cervical length 3.6 cm.  The leading edge of the
 placenta is 2.6 cm from the internal os.
Recommendations

 Please schedule an ultrasound for fetal anatomy is not
 already performed
 Otherwise, follow up ultrasounds as clinically indicated

 questions or concerns.

## 2014-05-06 NOTE — MAU Provider Note (Signed)
Chief Complaint:  Vaginal Bleeding   First Provider Initiated Contact with Patient 05/06/14 1329      HPI: Annette Miranda is a 33 y.o. G3P2002 at [redacted]w[redacted]d who presents to maternity admissions reporting awakening this morning  noting dark brown blood in which soaked through her underwear and pajamas. She wore a mini pad since then and has had continued brown light spotting. Associated with mild low back pain but no abdominal pain. Previous bleeding episode at 10 weeks. Denies dysuria, hematuria, frequency or urgency of urination. Last intercourse 3 days ago. No antecedent trauma. Denies contractions, leakage of fluid or vaginal bleeding. Good fetal movement.   Pregnancy Course: Normal outside [place] on 04/22/14. A pos. Quit smoking first tri.  Past Medical History: History reviewed. No pertinent past medical history.  Past obstetric history: OB History  Gravida Para Term Preterm AB SAB TAB Ectopic Multiple Living  3 2 2       2     # Outcome Date GA Lbr Len/2nd Weight Sex Delivery Anes PTL Lv  3 CUR           2 TRM 2003    F SVD   Y  1 TRM 2002    F SVD   Y      Past Surgical History: Past Surgical History  Procedure Laterality Date  . Cholecystectomy       Family History: Family History  Problem Relation Age of Onset  . Hypertension Mother   . Hypertension Father   . Hypertension Maternal Grandmother   . Cancer Maternal Grandmother   . Hypertension Maternal Grandfather   . Hypertension Paternal Grandmother   . Hypertension Paternal Grandfather   . Cancer Paternal Grandfather     Social History: History  Substance Use Topics  . Smoking status: Former Smoker    Types: Cigarettes    Quit date: 02/13/2014  . Smokeless tobacco: Not on file     Comment: trying to quit  . Alcohol Use: No    Allergies: No Known Allergies  Meds:  Prescriptions prior to admission  Medication Sig Dispense Refill  . fluticasone (FLONASE) 50 MCG/ACT nasal spray Place 1 spray into both nostrils  daily.       . Prenatal Vit-Fe Fumarate-FA (PRENATAL MULTIVITAMIN) TABS tablet Take 1 tablet by mouth daily at 12 noon.        ROS: Pertinent findings in history of present illness.  Physical Exam  Blood pressure 116/73, pulse 95, temperature 99 F (37.2 C), temperature source Oral, resp. rate 20, last menstrual period 12/06/2013. GENERAL: Well-developed, well-nourished female in no acute distress.  HEENT: normocephalic HEART: normal rate RESP: normal effort ABDOMEN: Soft, non-tender, gravid appropriate for gestational age. FHR 150 EXTREMITIES: Nontender, no edema NEURO: alert and oriented SPECULUM EXAM: NEFG, mod amount dark brown thin blood swabbed from cx, cervix clean without apparent active bleeding Cx: L/C        Labs: Results for orders placed during the hospital encounter of 05/06/14 (from the past 24 hour(s))  URINALYSIS, ROUTINE W REFLEX MICROSCOPIC     Status: Abnormal   Collection Time    05/06/14 11:50 AM      Result Value Ref Range   Color, Urine YELLOW  YELLOW   APPearance CLEAR  CLEAR   Specific Gravity, Urine >1.030 (*) 1.005 - 1.030   pH 6.0  5.0 - 8.0   Glucose, UA NEGATIVE  NEGATIVE mg/dL   Hgb urine dipstick LARGE (*) NEGATIVE   Bilirubin Urine NEGATIVE  NEGATIVE   Ketones, ur 15 (*) NEGATIVE mg/dL   Protein, ur NEGATIVE  NEGATIVE mg/dL   Urobilinogen, UA 0.2  0.0 - 1.0 mg/dL   Nitrite NEGATIVE  NEGATIVE   Leukocytes, UA NEGATIVE  NEGATIVE  URINE MICROSCOPIC-ADD ON     Status: Abnormal   Collection Time    05/06/14 11:50 AM      Result Value Ref Range   Squamous Epithelial / LPF FEW (*) RARE   RBC / HPF 0-2  <3 RBC/hpf   Bacteria, UA RARE  RARE  WET PREP, GENITAL     Status: Abnormal   Collection Time    05/06/14  1:57 PM      Result Value Ref Range   Yeast Wet Prep HPF POC NONE SEEN  NONE SEEN   Trich, Wet Prep NONE SEEN  NONE SEEN   Clue Cells Wet Prep HPF POC NONE SEEN  NONE SEEN   WBC, Wet Prep HPF POC FEW (*) NONE SEEN    Imaging:    MAU Course: GC/CT sent C/W Dr. Delsa Sale home with precautions and F/U in office Friday  Assessment: 1. Antepartum bleeding, second trimester   G3P2002 at [redacted]w[redacted]d with scant VB.   Plan: Discharge home with advice to return for any BRB or watery discharge Labor precautions and fetal kick counts    Medication List         fluticasone 50 MCG/ACT nasal spray  Commonly known as:  FLONASE  Place 1 spray into both nostrils daily.     prenatal multivitamin Tabs tablet  Take 1 tablet by mouth daily at 12 noon.       Follow-up Information   Follow up with [facility]. (Keep your scheduled prenatal appointment)    Contact information:   [address] [place] [address] [phone]      Schedule an appointment as soon as possible for a visit in 3 days to follow up.      Lorene Dy, CNM 05/06/2014 1:30 PM

## 2014-05-06 NOTE — Discharge Instructions (Signed)
Pelvic Rest °Pelvic rest is sometimes recommended for women when:  °· The placenta is partially or completely covering the opening of the cervix (placenta previa). °· There is bleeding between the uterine wall and the amniotic sac in the first trimester (subchorionic hemorrhage). °· The cervix begins to open without labor starting (incompetent cervix, cervical insufficiency). °· The labor is too early (preterm labor). °HOME CARE INSTRUCTIONS °· Do not have sexual intercourse, stimulation, or an orgasm. °· Do not use tampons, douche, or put anything in the vagina. °· Do not lift anything over 10 pounds (4.5 kg). °· Avoid strenuous activity or straining your pelvic muscles. °SEEK MEDICAL CARE IF:  °· You have any vaginal bleeding during pregnancy. Treat this as a potential emergency. °· You have cramping pain felt low in the stomach (stronger than menstrual cramps). °· You notice vaginal discharge (watery, mucus, or bloody). °· You have a low, dull backache. °· There are regular contractions or uterine tightening. °SEEK IMMEDIATE MEDICAL CARE IF: °You have vaginal bleeding and have placenta previa.  °Document Released: 03/31/2011 Document Revised: 02/26/2012 Document Reviewed: 03/31/2011 °ExitCare® Patient Information ©2014 [employer]. ° °

## 2014-05-06 NOTE — MAU Note (Signed)
Pt sates that she had some dark red bleeding this morning along with some back pain and her doctor told her to come to the hospital.

## 2014-05-06 NOTE — MAU Note (Signed)
Brownish red d/c noted in underwear and pj bottoms this morning, has continued, small amt on pad (per pt).  Had bleeding at 10 wks., not since.

## 2014-05-07 ENCOUNTER — Encounter: Payer: Self-pay | Admitting: Obstetrics & Gynecology

## 2014-05-07 ENCOUNTER — Ambulatory Visit (INDEPENDENT_AMBULATORY_CARE_PROVIDER_SITE_OTHER): Payer: PRIVATE HEALTH INSURANCE | Admitting: Obstetrics & Gynecology

## 2014-05-07 VITALS — BP 131/86 | HR 80 | Temp 98.3°F | Wt 237.0 lb

## 2014-05-07 DIAGNOSIS — O4690 Antepartum hemorrhage, unspecified, unspecified trimester: Secondary | ICD-10-CM

## 2014-05-07 DIAGNOSIS — O4692 Antepartum hemorrhage, unspecified, second trimester: Secondary | ICD-10-CM

## 2014-05-07 DIAGNOSIS — Z3480 Encounter for supervision of other normal pregnancy, unspecified trimester: Secondary | ICD-10-CM

## 2014-05-07 LAB — GC/CHLAMYDIA PROBE AMP
CT PROBE, AMP APTIMA: NEGATIVE
GC PROBE AMP APTIMA: NEGATIVE

## 2014-05-07 LAB — POCT URINALYSIS DIPSTICK
Glucose, UA: NEGATIVE
Ketones, UA: NEGATIVE
Leukocytes, UA: NEGATIVE
NITRITE UA: NEGATIVE
PROTEIN UA: NEGATIVE
pH, UA: 8.5

## 2014-05-07 NOTE — Progress Notes (Signed)
Subjective:    Chrystie Hagwood is a 33 y.o. female being seen today for her obstetrical visit. She is at [redacted]w[redacted]d  gestation. Patient reports: brown discharge . Fetal movement: normal.  Problem List Items Addressed This Visit   Supervision of other normal pregnancy - Primary   Relevant Orders      POCT urinalysis dipstick (Completed)      Fibrinogen (Completed)     Patient Active Problem List   Diagnosis Date Noted  . Tobacco smoking complicating pregnancy 23/76/2831  . 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 Supervision of other normal pregnancy 01/16/2014   Objective:    BP 131/86  Pulse 80  Temp(Src) 98.3 F (36.8 C)  Wt 107.502 kg (237 lb)  LMP 12/06/2013 FHT: 160 BPM  Uterine Size: Above Umbilicus     Assessment:    Pregnancy @ [redacted]w[redacted]d Second trimester bleeding--?marginal separation from accessory placental lobe, smoker    Plan:   Orders Placed This Encounter  Procedures  . Fibrinogen  . POCT urinalysis dipstick  U/S for growth 2hr GTT next visit Pelvic rest/avoid strenuous activity Labs, problem list reviewed and updated Follow up in 1week.

## 2014-05-08 ENCOUNTER — Other Ambulatory Visit: Payer: Self-pay | Admitting: Obstetrics & Gynecology

## 2014-05-08 DIAGNOSIS — Z1389 Encounter for screening for other disorder: Secondary | ICD-10-CM

## 2014-05-08 DIAGNOSIS — Z363 Encounter for antenatal screening for malformations: Secondary | ICD-10-CM

## 2014-05-08 LAB — FIBRINOGEN: FIBRINOGEN: 278 mg/dL (ref 204–475)

## 2014-05-09 DIAGNOSIS — O4692 Antepartum hemorrhage, unspecified, second trimester: Secondary | ICD-10-CM | POA: Insufficient documentation

## 2014-05-09 NOTE — Patient Instructions (Signed)
Vaginal Bleeding During Pregnancy, Second Trimester °A small amount of bleeding (spotting) from the vagina is relatively common in pregnancy. It usually stops on its own. Various things can cause bleeding or spotting in pregnancy. Some bleeding may be related to the pregnancy, and some may not. Sometimes the bleeding is normal and is not a problem. However, bleeding can also be a sign of something serious. Be sure to tell your health care provider about any vaginal bleeding right away. °Some possible causes of vaginal bleeding during the second trimester include: °· Infection, inflammation, or growths on the cervix.   °· The placenta may be partially or completely covering the opening of the cervix inside the uterus (placenta previa). °· The placenta may have separated from the uterus (abruption of the placenta).   °· You may be having early (preterm) labor.   °· The cervix may not be strong enough to keep a baby inside the uterus (cervical insufficiency).   °· Tiny cysts may have developed in the uterus instead of pregnancy tissue (molar pregnancy).  °HOME CARE INSTRUCTIONS  °Watch your condition for any changes. The following actions may help to lessen any discomfort you are feeling: °· Follow your health care provider's instructions for limiting your activity. If your health care provider orders bed rest, you may need to stay in bed and only get up to use the bathroom. However, your health care provider may allow you to continue light activity. °· If needed, make plans for someone to help with your regular activities and responsibilities while you are on bed rest. °· Keep track of the number of pads you use each day, how often you change pads, and how soaked (saturated) they are. Write this down. °· Do not use tampons. Do not douche. °· Do not have sexual intercourse or orgasms until approved by your health care provider. °· If you pass any tissue from your vagina, save the tissue so you can show it to your  health care provider. °· Only take over-the-counter or prescription medicines as directed by your health care provider. °· Do not take aspirin because it can make you bleed. °· Do not exercise or perform any strenuous activities or heavy lifting without your health care provider's permission. °· Keep all follow-up appointments as directed by your health care provider. °SEEK MEDICAL CARE IF: °· You have any vaginal bleeding during any part of your pregnancy. °· You have cramps or labor pains. °SEEK IMMEDIATE MEDICAL CARE IF:  °· You have severe cramps in your back or belly (abdomen). °· You have contractions. °· You have a fever, not controlled by medicine. °· You have chills. °· You pass large clots or tissue from your vagina. °· Your bleeding increases. °· You feel lightheaded or weak, or you have fainting episodes. °· You are leaking fluid or have a gush of fluid from your vagina. °MAKE SURE YOU: °· Understand these instructions. °· Will watch your condition. °· Will get help right away if you are not doing well or get worse. °Document Released: 09/13/2005 Document Revised: 09/24/2013 Document Reviewed: 08/11/2013 °ExitCare® Patient Information ©2014 [employer]. ° °

## 2014-05-13 ENCOUNTER — Ambulatory Visit (INDEPENDENT_AMBULATORY_CARE_PROVIDER_SITE_OTHER): Payer: PRIVATE HEALTH INSURANCE

## 2014-05-13 ENCOUNTER — Encounter: Payer: Self-pay | Admitting: Obstetrics & Gynecology

## 2014-05-13 ENCOUNTER — Ambulatory Visit (INDEPENDENT_AMBULATORY_CARE_PROVIDER_SITE_OTHER): Payer: PRIVATE HEALTH INSURANCE | Admitting: Obstetrics & Gynecology

## 2014-05-13 VITALS — BP 130/82 | HR 82 | Temp 99.1°F | Wt 248.0 lb

## 2014-05-13 DIAGNOSIS — Z1389 Encounter for screening for other disorder: Secondary | ICD-10-CM

## 2014-05-13 DIAGNOSIS — Z3480 Encounter for supervision of other normal pregnancy, unspecified trimester: Secondary | ICD-10-CM

## 2014-05-13 LAB — POCT URINALYSIS DIPSTICK
Bilirubin, UA: NEGATIVE
Blood, UA: NEGATIVE
Glucose, UA: NEGATIVE
KETONES UA: NEGATIVE
LEUKOCYTES UA: NEGATIVE
Nitrite, UA: NEGATIVE
PROTEIN UA: NEGATIVE
SPEC GRAV UA: 1.01
Urobilinogen, UA: NEGATIVE
pH, UA: 7

## 2014-05-13 LAB — US OB FOLLOW UP

## 2014-05-13 NOTE — Progress Notes (Signed)
Patient reports she is doing better than last week. Patient had [place] today.

## 2014-05-14 ENCOUNTER — Telehealth: Payer: Self-pay | Admitting: *Deleted

## 2014-05-14 NOTE — Telephone Encounter (Signed)
Call was made to office in regards to Annette Miranda referral for MFM consult.  Rosa states that they are in need of more information regarding pt need for MFM consult.  Rosa would like a phone call placed to her if needed or can fax over information they are needing to number below. Contact numbers: Phone [phone] Fax     [phone]

## 2014-05-15 NOTE — Patient Instructions (Signed)
Glucose Tolerance Test This is a test to see how your body processes carbohydrates. This test is often done to check patients for diabetes or the possibility of developing it. PREPARATION FOR TEST You should have nothing to eat or drink 12 hours before the test. You will be given a form of sugar (glucose) and then blood samples will be drawn from your vein to determine the level of sugar in your blood. Alternatively, blood may be drawn from your finger for testing. You should not smoke or exercise during the test. NORMAL FINDINGS  Fasting: 70-115 mg/dL  30 minutes: less than 200 mg/dL  1 hour: less than 200 mg/dL  2 hours: less than 140 mg/dL  3 hours: 70-115 mg/dL  4 hours: 70-115 mg/dL Ranges for normal findings may vary among different laboratories and hospitals. You should always check with your doctor after having lab work or other tests done to discuss the meaning of your test results and whether your values are considered within normal limits. MEANING OF TEST Your caregiver will go over the test results with you and discuss the importance and meaning of your results, as well as treatment options and the need for additional tests. OBTAINING THE TEST RESULTS It is your responsibility to obtain your test results. Ask the lab or department performing the test when and how you will get your results. Document Released: 12/27/2004 Document Revised: 02/26/2012 Document Reviewed: 11/14/2008 ExitCare Patient Information 2014 [employer].  

## 2014-05-15 NOTE — Progress Notes (Signed)
Subjective:    Annette Miranda is a 33 y.o. female being seen today for her obstetrical visit. She is at [redacted]w[redacted]d   gestation. Patient reports: no complaints . Fetal movement: normal.  Problem List Items Addressed This Visit   Supervision of other normal pregnancy - Primary   Relevant Orders      POCT urinalysis dipstick (Completed)     Patient Active Problem List   Diagnosis Date Noted  . Second trimester bleeding 05/09/2014  . Tobacco smoking complicating pregnancy 38/88/2800  . 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 Supervision of other normal pregnancy 01/16/2014   Objective:    BP 130/82  Pulse 82  Temp(Src) 99.1 F (37.3 C)  Wt 112.492 kg (248 lb)  LMP 12/06/2013 FHT: 160 BPM  Uterine Size: Above Umbilicus     Assessment:    Pregnancy @ [redacted]w[redacted]d Second trimester bleeding resolved  Plan:   Orders Placed This Encounter  Procedures  . POCT urinalysis dipstick   2hr GTT next visit Pelvic rest/avoid strenuous activity Labs, problem list reviewed and updated Follow up in 2 weeks.

## 2014-05-19 ENCOUNTER — Other Ambulatory Visit: Payer: Self-pay | Admitting: *Deleted

## 2014-05-19 DIAGNOSIS — O4692 Antepartum hemorrhage, unspecified, second trimester: Secondary | ICD-10-CM

## 2014-05-19 NOTE — Telephone Encounter (Signed)
Upon reviewing chart there is no order seen for pt referral with [facility] MFM.  Dr Delsa Sale can you please review and/or enter order in chart so that we may get appropriate paperwork to [facility].

## 2014-05-20 ENCOUNTER — Other Ambulatory Visit: Payer: Self-pay | Admitting: Obstetrics & Gynecology

## 2014-05-20 ENCOUNTER — Ambulatory Visit (INDEPENDENT_AMBULATORY_CARE_PROVIDER_SITE_OTHER): Payer: PRIVATE HEALTH INSURANCE | Admitting: Obstetrics & Gynecology

## 2014-05-20 ENCOUNTER — Other Ambulatory Visit: Payer: PRIVATE HEALTH INSURANCE

## 2014-05-20 ENCOUNTER — Encounter: Payer: Self-pay | Admitting: Obstetrics & Gynecology

## 2014-05-20 VITALS — BP 110/73 | HR 83 | Temp 98.2°F | Wt 248.0 lb

## 2014-05-20 DIAGNOSIS — Z3480 Encounter for supervision of other normal pregnancy, unspecified trimester: Secondary | ICD-10-CM

## 2014-05-20 DIAGNOSIS — Z1389 Encounter for screening for other disorder: Secondary | ICD-10-CM

## 2014-05-20 LAB — POCT URINALYSIS DIPSTICK
Blood, UA: NEGATIVE
Glucose, UA: NEGATIVE
Ketones, UA: NEGATIVE
LEUKOCYTES UA: NEGATIVE
NITRITE UA: NEGATIVE
PH UA: 5
Protein, UA: NEGATIVE
Spec Grav, UA: 1.015

## 2014-05-20 LAB — CBC
HCT: 38.4 % (ref 36.0–46.0)
Hemoglobin: 13.1 g/dL (ref 12.0–15.0)
MCH: 31.8 pg (ref 26.0–34.0)
MCHC: 34.1 g/dL (ref 30.0–36.0)
MCV: 93.2 fL (ref 78.0–100.0)
PLATELETS: 179 10*3/uL (ref 150–400)
RBC: 4.12 MIL/uL (ref 3.87–5.11)
RDW: 13.1 % (ref 11.5–15.5)
WBC: 9.2 10*3/uL (ref 4.0–10.5)

## 2014-05-20 NOTE — Telephone Encounter (Signed)
Pt is being referred-->MFM at [facility]

## 2014-05-20 NOTE — Progress Notes (Signed)
Subjective:    Annette Miranda is a 33 y.o. female being seen today for her obstetrical visit. She is at [redacted]w[redacted]d  gestation. Patient reports: no complaints . Fetal movement: normal.  Problem List Items Addressed This Visit   Supervision of other normal pregnancy - Primary   Relevant Orders      POCT urinalysis dipstick (Completed)     Patient Active Problem List   Diagnosis Date Noted  . Second trimester bleeding 05/09/2014  . Tobacco smoking complicating pregnancy 62/37/6283  . 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 Supervision of other normal pregnancy 01/16/2014   Objective:    LMP 12/06/2013 FHT: 160 BPM  Uterine Size: Above Umbilicus     Assessment:    Pregnancy @ [redacted]w[redacted]d Second trimester bleeding resolved  Plan:   Orders Placed This Encounter  Procedures  . POCT urinalysis dipstick    Pelvic rest/avoid strenuous activity Labs, problem list reviewed and updated Follow up in 2 weeks.

## 2014-05-20 NOTE — Patient Instructions (Signed)
Second Trimester of Pregnancy The second trimester is from week 13 through week 28, months 4 through 6. The second trimester is often a time when you feel your best. Your body has also adjusted to being pregnant, and you begin to feel better physically. Usually, morning sickness has lessened or quit completely, you may have more energy, and you may have an increase in appetite. The second trimester is also a time when the fetus is growing rapidly. At the end of the sixth month, the fetus is about 9 inches long and weighs about 1 pounds. You will likely begin to feel the baby move (quickening) between 18 and 20 weeks of the pregnancy. BODY CHANGES Your body goes through many changes during pregnancy. The changes vary from woman to woman.   Your weight will continue to increase. You will notice your lower abdomen bulging out.  You may begin to get stretch marks on your hips, abdomen, and breasts.  You may develop headaches that can be relieved by medicines approved by your caregiver.  You may urinate more often because the fetus is pressing on your bladder.  You may develop or continue to have heartburn as a result of your pregnancy.  You may develop constipation because certain hormones are causing the muscles that push waste through your intestines to slow down.  You may develop hemorrhoids or swollen, bulging veins (varicose veins).  You may have back pain because of the weight gain and pregnancy hormones relaxing your joints between the bones in your pelvis and as a result of a shift in weight and the muscles that support your balance.  Your breasts will continue to grow and be tender.  Your gums may bleed and may be sensitive to brushing and flossing.  Dark spots or blotches (chloasma, mask of pregnancy) may develop on your face. This will likely fade after the baby is born.  A dark line from your belly button to the pubic area (linea nigra) may appear. This will likely fade after the  baby is born. WHAT TO EXPECT AT YOUR PRENATAL VISITS During a routine prenatal visit:  You will be weighed to make sure you and the fetus are growing normally.  Your blood pressure will be taken.  Your abdomen will be measured to track your baby's growth.  The fetal heartbeat will be listened to.  Any test results from the previous visit will be discussed. Your caregiver may ask you:  How you are feeling.  If you are feeling the baby move.  If you have had any abnormal symptoms, such as leaking fluid, bleeding, severe headaches, or abdominal cramping.  If you have any questions. Other tests that may be performed during your second trimester include:  Blood tests that check for:  Low iron levels (anemia).  Gestational diabetes (between 24 and 28 weeks).  Rh antibodies.  Urine tests to check for infections, diabetes, or protein in the urine.  An ultrasound to confirm the proper growth and development of the baby.  An amniocentesis to check for possible genetic problems.  Fetal screens for spina bifida and Down syndrome. HOME CARE INSTRUCTIONS   Avoid all smoking, herbs, alcohol, and unprescribed drugs. These chemicals affect the formation and growth of the baby.  Follow your caregiver's instructions regarding medicine use. There are medicines that are either safe or unsafe to take during pregnancy.  Exercise only as directed by your caregiver. Experiencing uterine cramps is a good sign to stop exercising.  Continue to eat regular,   healthy meals.  Wear a good support bra for breast tenderness.  Do not use hot tubs, steam rooms, or saunas.  Wear your seat belt at all times when driving.  Avoid raw meat, uncooked cheese, cat litter boxes, and soil used by cats. These carry germs that can cause birth defects in the baby.  Take your prenatal vitamins.  Try taking a stool softener (if your caregiver approves) if you develop constipation. Eat more high-fiber foods,  such as fresh vegetables or fruit and whole grains. Drink plenty of fluids to keep your urine clear or pale yellow.  Take warm sitz baths to soothe any pain or discomfort caused by hemorrhoids. Use hemorrhoid cream if your caregiver approves.  If you develop varicose veins, wear support hose. Elevate your feet for 15 minutes, 3 4 times a day. Limit salt in your diet.  Avoid heavy lifting, wear low heel shoes, and practice good posture.  Rest with your legs elevated if you have leg cramps or low back pain.  Visit your dentist if you have not gone yet during your pregnancy. Use a soft toothbrush to brush your teeth and be gentle when you floss.  A sexual relationship may be continued unless your caregiver directs you otherwise.  Continue to go to all your prenatal visits as directed by your caregiver. SEEK MEDICAL CARE IF:   You have dizziness.  You have mild pelvic cramps, pelvic pressure, or nagging pain in the abdominal area.  You have persistent nausea, vomiting, or diarrhea.  You have a bad smelling vaginal discharge.  You have pain with urination. SEEK IMMEDIATE MEDICAL CARE IF:   You have a fever.  You are leaking fluid from your vagina.  You have spotting or bleeding from your vagina.  You have severe abdominal cramping or pain.  You have rapid weight gain or loss.  You have shortness of breath with chest pain.  You notice sudden or extreme swelling of your face, hands, ankles, feet, or legs.  You have not felt your baby move in over an hour.  You have severe headaches that do not go away with medicine.  You have vision changes. Document Released: 11/28/2001 Document Revised: 08/06/2013 Document Reviewed: 02/04/2013 [employer] Patient Information 2014 [employer].  

## 2014-05-20 NOTE — Addendum Note (Signed)
Addended by: Lewie Loron D on: 05/20/2014 12:31 PM   Modules accepted: Orders

## 2014-05-21 ENCOUNTER — Encounter (HOSPITAL_COMMUNITY): Payer: Self-pay | Admitting: Obstetrics & Gynecology

## 2014-05-21 LAB — GLUCOSE TOLERANCE, 2 HOURS W/ 1HR
GLUCOSE, FASTING: 69 mg/dL — AB (ref 70–99)
Glucose, 1 hour: 106 mg/dL (ref 70–170)
Glucose, 2 hour: 92 mg/dL (ref 70–139)

## 2014-05-21 LAB — RPR

## 2014-05-21 LAB — HIV ANTIBODY (ROUTINE TESTING W REFLEX): HIV 1&2 Ab, 4th Generation: NONREACTIVE

## 2014-05-26 NOTE — Telephone Encounter (Signed)
[id number] - brm - mfm scheduled

## 2014-06-01 ENCOUNTER — Ambulatory Visit (HOSPITAL_COMMUNITY)
Admission: RE | Admit: 2014-06-01 | Discharge: 2014-06-01 | Disposition: A | Discharge status: Home / Self Care | Payer: PRIVATE HEALTH INSURANCE | Source: Ambulatory Visit | Attending: Obstetrics & Gynecology | Admitting: Obstetrics & Gynecology

## 2014-06-01 VITALS — BP 141/76 | HR 97

## 2014-06-01 DIAGNOSIS — Z3480 Encounter for supervision of other normal pregnancy, unspecified trimester: Secondary | ICD-10-CM

## 2014-06-01 DIAGNOSIS — O4692 Antepartum hemorrhage, unspecified, second trimester: Secondary | ICD-10-CM

## 2014-06-01 DIAGNOSIS — O99330 Smoking (tobacco) complicating pregnancy, unspecified trimester: Secondary | ICD-10-CM

## 2014-06-01 DIAGNOSIS — J329 Chronic sinusitis, unspecified: Secondary | ICD-10-CM

## 2014-06-01 DIAGNOSIS — Z363 Encounter for antenatal screening for malformations: Secondary | ICD-10-CM | POA: Insufficient documentation

## 2014-06-01 DIAGNOSIS — Z1389 Encounter for screening for other disorder: Secondary | ICD-10-CM

## 2014-06-01 IMAGING — US US OB DETAIL+14 WK
1 series · 12 of 28 positions shown · non-contrast
Comparison: none

[Series 1: us ob detail+14 wk · 0.23mm/px · 99 acquisitions, 12 frames shown]
[im 4/99]
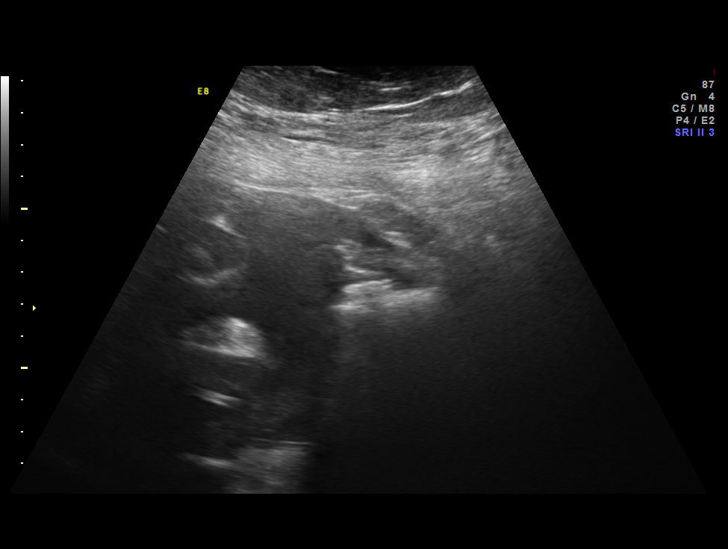
[im 11/99]
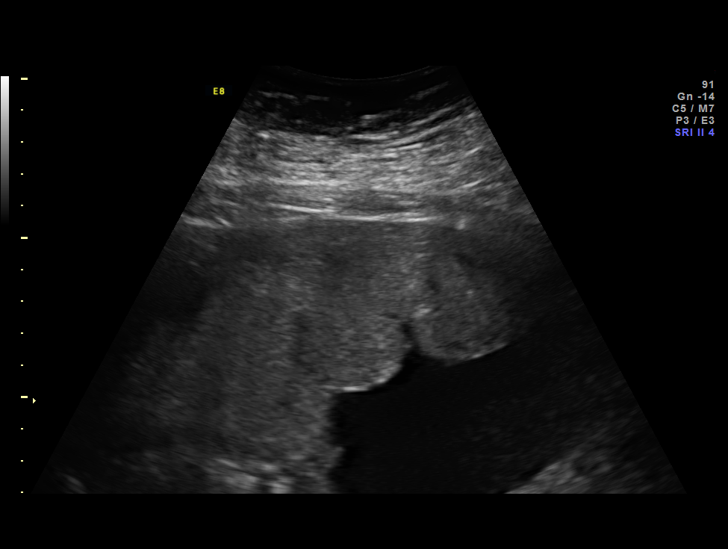
[im 19/99]
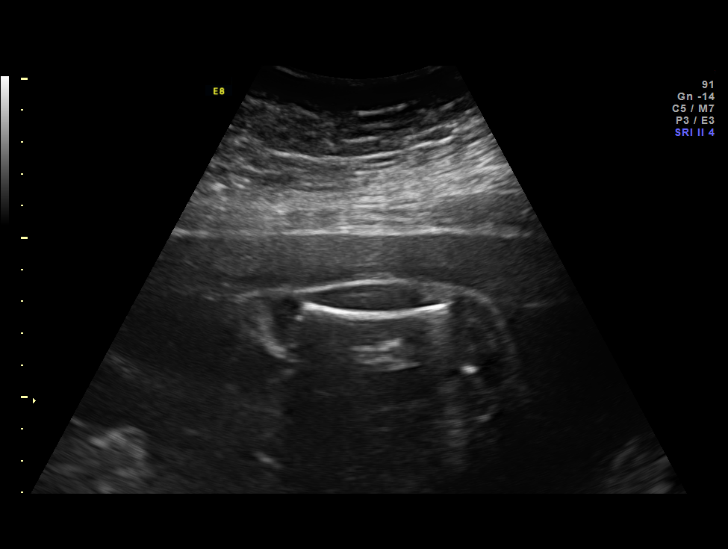
[im 30/99]
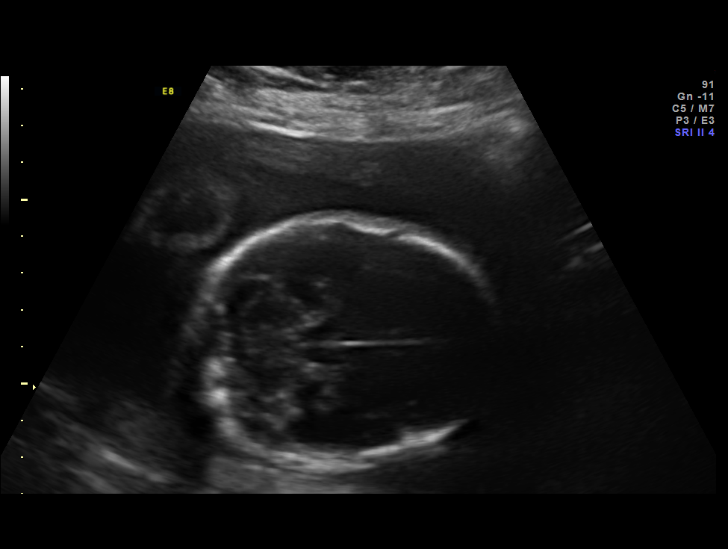
[im 37/99]
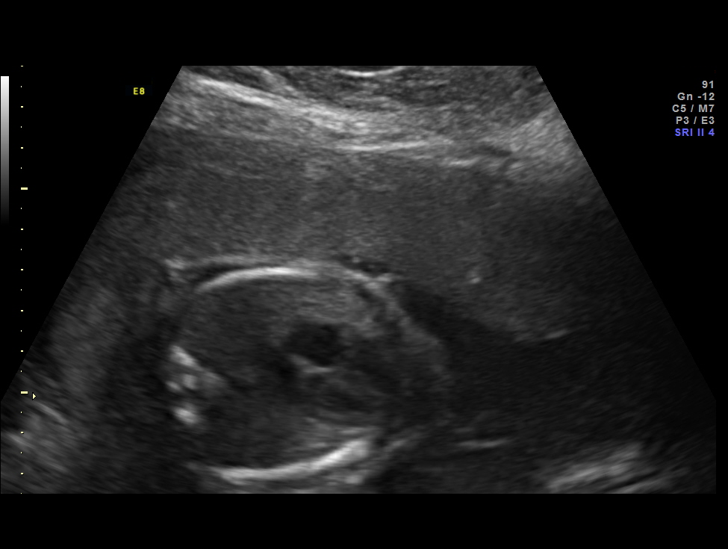
[im 44/99]
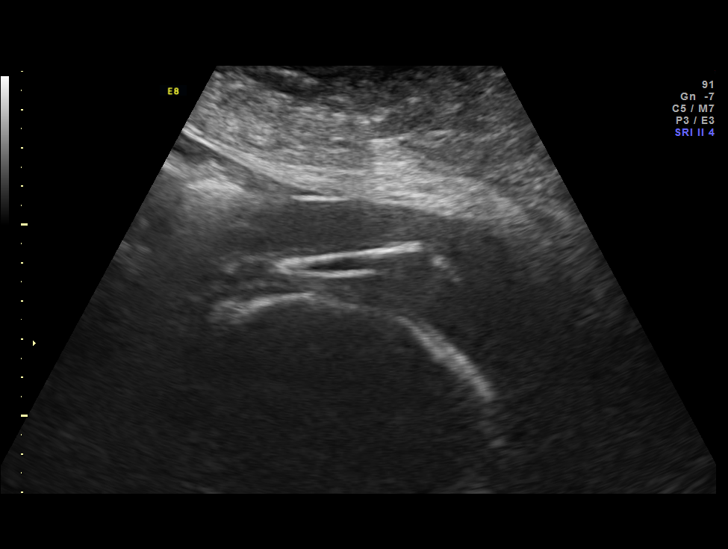
[im 55/99]
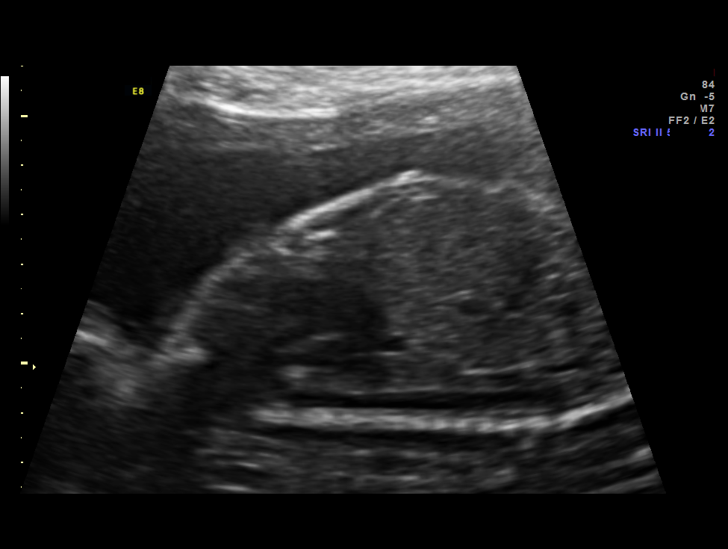
[im 62/99]
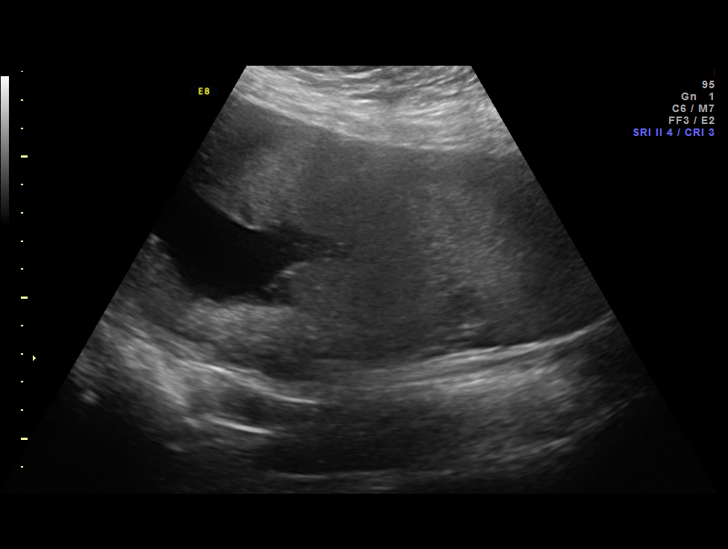
[im 69/99]
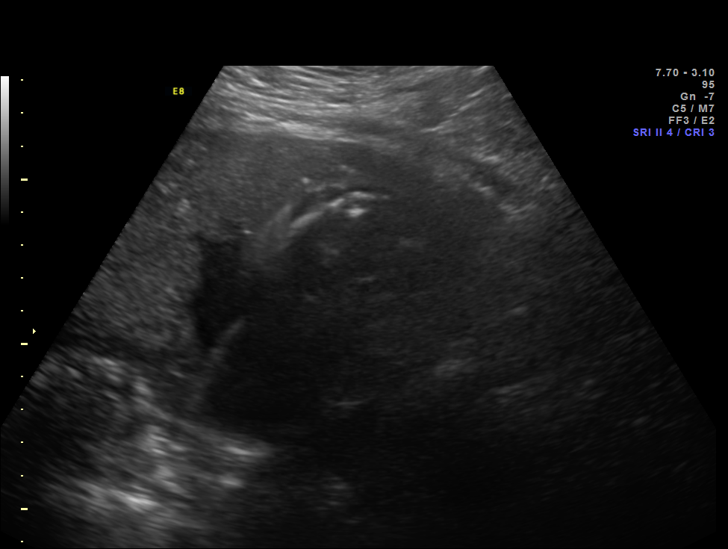
[im 80/99]
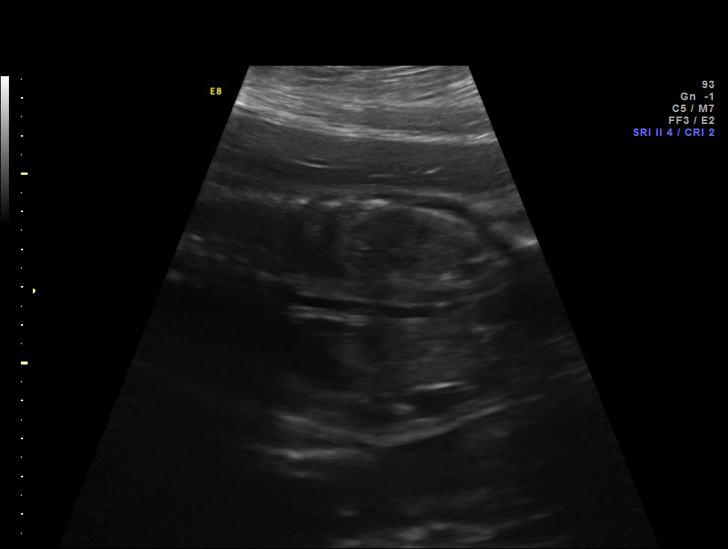
[im 88/99]
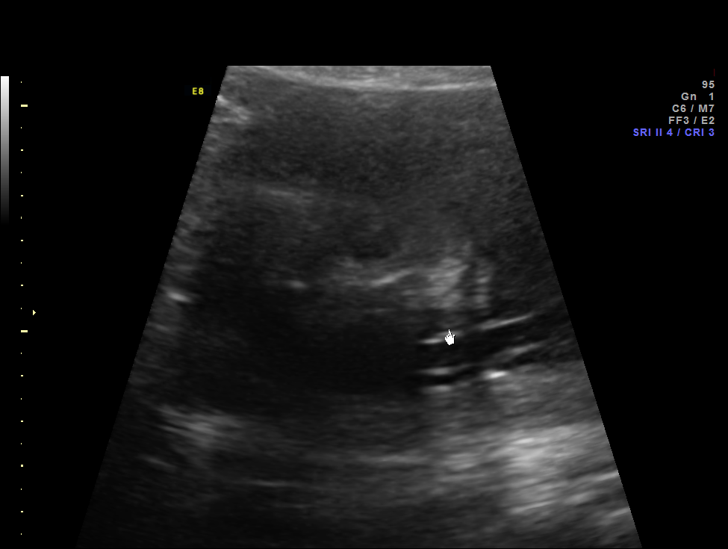
[im 95/99]
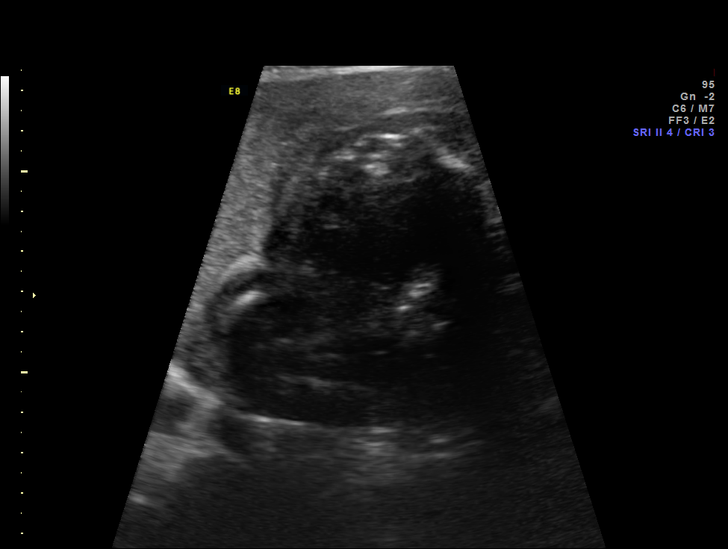

[12 of 28 positions shown; findings below may reference images not displayed]

OBSTETRICS REPORT
                      (Signed Final 06/01/2014 [DATE])

Service(s) Provided

 US OB DETAIL + 14 WK                                  76811.0
Indications

 Detailed fetal anatomic survey
 Asthma
 Cigarette smoker
 Vaginal bleeding
 Maternal morbid obesity
Fetal Evaluation

 Num Of Fetuses:    1
 Fetal Heart Rate:  150                          bpm
 Cardiac Activity:  Observed
 Presentation:      Breech
 Placenta:          Anterior, above cervical os
 P. Cord            Not well visualized
 Insertion:

 Amniotic Fluid
 AFI FV:      Subjectively within normal limits
                                             Larg Pckt:     5.4  cm
Biometry

 BPD:     66.3  mm     G. Age:  26w 5d                CI:         75.8   70 - 86
 OFD:     87.5  mm                                    FL/HC:      19.4   18.6 -

 HC:       247  mm     G. Age:  26w 6d       37  %    HC/AC:      1.13   1.04 -

 AC:     218.6  mm     G. Age:  26w 3d       38  %    FL/BPD:     72.1   71 - 87
 FL:      47.8  mm     G. Age:  26w 0d       24  %    FL/AC:      21.9   20 - 24
 HUM:     45.8  mm     G. Age:  27w 0d       61  %

 Est. FW:     915  gm           2 lb     51  %
Gestational Age

 U/S Today:     26w 4d                                        EDD:   09/03/14
 Best:          26w 3d     Det. By:  Early Ultrasound         EDD:   09/04/14
                                     (02/24/14)
Anatomy
 Cranium:          Appears normal         Aortic Arch:      Appears normal
 Fetal Cavum:      Appears normal         Ductal Arch:      Appears normal
 Ventricles:       Appears normal         Diaphragm:        Appears normal
 Choroid Plexus:   Appears normal         Stomach:          Appears normal, left
                                                            sided
 Cerebellum:       Appears normal         Abdomen:          Appears normal
 Posterior Fossa:  Appears normal         Abdominal Wall:   Appears nml (cord
                                                            insert, abd wall)
 Nuchal Fold:      Not applicable (>20    Cord Vessels:     Appears normal (3
                   wks GA)                                  vessel cord)
 Face:             Orbits nl; profile not Kidneys:          Appear normal
                   well visualized
 Lips:             Appears normal         Bladder:          Appears normal
 Heart:            Appears normal         Spine:            Appears normal
                   (4CH, axis, and
                   situs)
 RVOT:             Not well visualized    Lower             Appears normal
                                          Extremities:
 LVOT:             Not well visualized    Upper             Appears normal
                                          Extremities:

 Other:  Lt. Heel visualized. Technically difficult due to maternal habitus and
         fetal position.
Cervix Uterus Adnexa

 Cervical Length:    3.3      cm

 Cervix:       Normal appearance by transabdominal scan.

 Adnexa:     No abnormality visualized.
Impression

 SIUP at 26+3 weeks
 Normal detailed fetal anatomy; limited views outflow tracts
 and profile
 Normal amniotic fluid volume
 Measurements consistent with prior US; EFW at the 51st
 %tile
 Anterior/right lateral placenta; very small submucosal mass
 on posterior uterine wall (2.6 x 2.0 x 4.0 cms) - most likely
 fibroid but could be small unresolved SCH
Recommendations

 Follow-up ultrasound in 4 weeks to complete anatomic survey
 and reassess submucosal mass

 questions or concerns.

## 2014-06-03 ENCOUNTER — Encounter: Payer: PRIVATE HEALTH INSURANCE | Admitting: Obstetrics & Gynecology

## 2014-06-04 ENCOUNTER — Encounter: Payer: Self-pay | Admitting: Obstetrics

## 2014-06-04 ENCOUNTER — Ambulatory Visit (INDEPENDENT_AMBULATORY_CARE_PROVIDER_SITE_OTHER): Payer: PRIVATE HEALTH INSURANCE | Admitting: Obstetrics

## 2014-06-04 VITALS — BP 124/80 | HR 82 | Temp 98.4°F | Wt 252.0 lb

## 2014-06-04 DIAGNOSIS — O4692 Antepartum hemorrhage, unspecified, second trimester: Secondary | ICD-10-CM

## 2014-06-04 DIAGNOSIS — K219 Gastro-esophageal reflux disease without esophagitis: Secondary | ICD-10-CM

## 2014-06-04 DIAGNOSIS — Z3480 Encounter for supervision of other normal pregnancy, unspecified trimester: Secondary | ICD-10-CM

## 2014-06-04 DIAGNOSIS — O4690 Antepartum hemorrhage, unspecified, unspecified trimester: Secondary | ICD-10-CM

## 2014-06-04 DIAGNOSIS — Z3483 Encounter for supervision of other normal pregnancy, third trimester: Secondary | ICD-10-CM

## 2014-06-04 MED ORDER — OMEPRAZOLE 20 MG PO CPDR: 20.0000 mg | DELAYED_RELEASE_CAPSULE | Freq: Every day | ORAL | Status: DC

## 2014-06-04 NOTE — Progress Notes (Signed)
Subjective:    Annette Miranda is a 33 y.o. female being seen today for her obstetrical visit. She is at [redacted]w[redacted]d gestation. Patient reports: no complaints . Fetal movement: normal.  Problem List Items Addressed This Visit   None    Visit Diagnoses   GERD without esophagitis    -  Primary    Relevant Medications       omeprazole (PRILOSEC) capsule      Patient Active Problem List   Diagnosis Date Noted  . Second trimester bleeding 05/09/2014  . Tobacco smoking complicating pregnancy 36/46/8032  . 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 Supervision of other normal pregnancy 01/16/2014   Objective:    BP 124/80  Pulse 82  Temp(Src) 98.4 F (36.9 C)  Wt 252 lb (114.306 kg)  LMP 12/06/2013 FHT: 150 BPM  Uterine Size: size equals dates     Assessment:    Pregnancy @ [redacted]w[redacted]d    Plan:    OBGCT: ordered.  Labs, problem list reviewed and updated 2 hr GTT planned Follow up in 2 weeks.

## 2014-06-05 ENCOUNTER — Other Ambulatory Visit: Payer: Self-pay | Admitting: Obstetrics & Gynecology

## 2014-06-05 DIAGNOSIS — O4690 Antepartum hemorrhage, unspecified, unspecified trimester: Secondary | ICD-10-CM

## 2014-06-18 ENCOUNTER — Encounter: Payer: Self-pay | Admitting: Obstetrics & Gynecology

## 2014-06-18 ENCOUNTER — Ambulatory Visit (INDEPENDENT_AMBULATORY_CARE_PROVIDER_SITE_OTHER): Payer: PRIVATE HEALTH INSURANCE | Admitting: Obstetrics & Gynecology

## 2014-06-18 VITALS — BP 127/79 | HR 82 | Temp 98.7°F | Wt 258.0 lb

## 2014-06-18 DIAGNOSIS — Z3483 Encounter for supervision of other normal pregnancy, third trimester: Secondary | ICD-10-CM

## 2014-06-18 DIAGNOSIS — Z3480 Encounter for supervision of other normal pregnancy, unspecified trimester: Secondary | ICD-10-CM

## 2014-06-21 NOTE — Patient Instructions (Signed)
Third Trimester of Pregnancy The third trimester is from week 29 through week 42, months 7 through 9. The third trimester is a time when the fetus is growing rapidly. At the end of the ninth month, the fetus is about 20 inches in length and weighs 6-10 pounds.  BODY CHANGES Your body goes through many changes during pregnancy. The changes vary from woman to woman.   Your weight will continue to increase. You can expect to gain 25-35 pounds (11-16 kg) by the end of the pregnancy.  You may begin to get stretch marks on your hips, abdomen, and breasts.  You may urinate more often because the fetus is moving lower into your pelvis and pressing on your bladder.  You may develop or continue to have heartburn as a result of your pregnancy.  You may develop constipation because certain hormones are causing the muscles that push waste through your intestines to slow down.  You may develop hemorrhoids or swollen, bulging veins (varicose veins).  You may have pelvic pain because of the weight gain and pregnancy hormones relaxing your joints between the bones in your pelvis. Backaches may result from overexertion of the muscles supporting your posture.  You may have changes in your hair. These can include thickening of your hair, rapid growth, and changes in texture. Some women also have hair loss during or after pregnancy, or hair that feels dry or thin. Your hair will most likely return to normal after your baby is born.  Your breasts will continue to grow and be tender. A yellow discharge may leak from your breasts called colostrum.  Your belly button may stick out.  You may feel short of breath because of your expanding uterus.  You may notice the fetus "dropping," or moving lower in your abdomen.  You may have a bloody mucus discharge. This usually occurs a few days to a week before labor begins.  Your cervix becomes thin and soft (effaced) near your due date. WHAT TO EXPECT AT YOUR PRENATAL  EXAMS  You will have prenatal exams every 2 weeks until week 36. Then, you will have weekly prenatal exams. During a routine prenatal visit:  You will be weighed to make sure you and the fetus are growing normally.  Your blood pressure is taken.  Your abdomen will be measured to track your baby's growth.  The fetal heartbeat will be listened to.  Any test results from the previous visit will be discussed.  You may have a cervical check near your due date to see if you have effaced. At around 36 weeks, your caregiver will check your cervix. At the same time, your caregiver will also perform a test on the secretions of the vaginal tissue. This test is to determine if a type of bacteria, Group B streptococcus, is present. Your caregiver will explain this further. Your caregiver may ask you:  What your birth plan is.  How you are feeling.  If you are feeling the baby move.  If you have had any abnormal symptoms, such as leaking fluid, bleeding, severe headaches, or abdominal cramping.  If you have any questions. Other tests or screenings that may be performed during your third trimester include:  Blood tests that check for low iron levels (anemia).  Fetal testing to check the health, activity level, and growth of the fetus. Testing is done if you have certain medical conditions or if there are problems during the pregnancy. FALSE LABOR You may feel small, irregular contractions that   eventually go away. These are called Braxton Hicks contractions, or false labor. Contractions may last for hours, days, or even weeks before true labor sets in. If contractions come at regular intervals, intensify, or become painful, it is best to be seen by your caregiver.  SIGNS OF LABOR   Menstrual-like cramps.  Contractions that are 5 minutes apart or less.  Contractions that start on the top of the uterus and spread down to the lower abdomen and back.  A sense of increased pelvic pressure or back  pain.  A watery or bloody mucus discharge that comes from the vagina. If you have any of these signs before the 37th week of pregnancy, call your caregiver right away. You need to go to the hospital to get checked immediately. HOME CARE INSTRUCTIONS   Avoid all smoking, herbs, alcohol, and unprescribed drugs. These chemicals affect the formation and growth of the baby.  Follow your caregiver's instructions regarding medicine use. There are medicines that are either safe or unsafe to take during pregnancy.  Exercise only as directed by your caregiver. Experiencing uterine cramps is a good sign to stop exercising.  Continue to eat regular, healthy meals.  Wear a good support bra for breast tenderness.  Do not use hot tubs, steam rooms, or saunas.  Wear your seat belt at all times when driving.  Avoid raw meat, uncooked cheese, cat litter boxes, and soil used by cats. These carry germs that can cause birth defects in the baby.  Take your prenatal vitamins.  Try taking a stool softener (if your caregiver approves) if you develop constipation. Eat more high-fiber foods, such as fresh vegetables or fruit and whole grains. Drink plenty of fluids to keep your urine clear or pale yellow.  Take warm sitz baths to soothe any pain or discomfort caused by hemorrhoids. Use hemorrhoid cream if your caregiver approves.  If you develop varicose veins, wear support hose. Elevate your feet for 15 minutes, 3-4 times a day. Limit salt in your diet.  Avoid heavy lifting, wear low heal shoes, and practice good posture.  Rest a lot with your legs elevated if you have leg cramps or low back pain.  Visit your dentist if you have not gone during your pregnancy. Use a soft toothbrush to brush your teeth and be gentle when you floss.  A sexual relationship may be continued unless your caregiver directs you otherwise.  Do not travel far distances unless it is absolutely necessary and only with the approval  of your caregiver.  Take prenatal classes to understand, practice, and ask questions about the labor and delivery.  Make a trial run to the hospital.  Pack your hospital bag.  Prepare the baby's nursery.  Continue to go to all your prenatal visits as directed by your caregiver. SEEK MEDICAL CARE IF:  You are unsure if you are in labor or if your water has broken.  You have dizziness.  You have mild pelvic cramps, pelvic pressure, or nagging pain in your abdominal area.  You have persistent nausea, vomiting, or diarrhea.  You have a bad smelling vaginal discharge.  You have pain with urination. SEEK IMMEDIATE MEDICAL CARE IF:   You have a fever.  You are leaking fluid from your vagina.  You have spotting or bleeding from your vagina.  You have severe abdominal cramping or pain.  You have rapid weight loss or gain.  You have shortness of breath with chest pain.  You notice sudden or extreme swelling   of your face, hands, ankles, feet, or legs.  You have not felt your baby move in over an hour.  You have severe headaches that do not go away with medicine.  You have vision changes. Document Released: 11/28/2001 Document Revised: 12/09/2013 Document Reviewed: 02/04/2013 [employer] Patient Information 2015 [employer]. This information is not intended to replace advice given to you by your health care provider. Make sure you discuss any questions you have with your health care provider.  

## 2014-06-21 NOTE — Progress Notes (Signed)
Subjective:    Annette Miranda is a 33 y.o. female being seen today for her obstetrical visit. She is at [redacted]w[redacted]d gestation. Patient reports no complaints. Fetal movement: normal.  Problem List Items Addressed This Visit   Supervision of other normal pregnancy - Primary   Relevant Orders      POCT urinalysis dipstick     Patient Active Problem List   Diagnosis Date Noted  . GERD without esophagitis 06/04/2014  . Second trimester bleeding 05/09/2014  . Tobacco smoking complicating pregnancy 56/97/9480  . 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 Supervision of other normal pregnancy 01/16/2014   Objective:    BP 127/79  Pulse 82  Temp(Src) 98.7 F (37.1 C)  Wt 117.028 kg (258 lb)  LMP 12/06/2013 FHT:  160 BPM  Uterine Size: size equals dates  Presentation: unsure     Assessment:    Pregnancy @ [redacted]w[redacted]d weeks   Plan:     labs reviewed, problem list updated  Orders Placed This Encounter  Procedures  . POCT urinalysis dipstick   Follow up in 2 Weeks.

## 2014-06-22 LAB — POCT URINALYSIS DIPSTICK
Bilirubin, UA: NEGATIVE
Glucose, UA: NEGATIVE
KETONES UA: NEGATIVE
Leukocytes, UA: NEGATIVE
Nitrite, UA: NEGATIVE
PH UA: 5
Protein, UA: NEGATIVE
RBC UA: NEGATIVE
SPEC GRAV UA: 1.025
Urobilinogen, UA: NEGATIVE

## 2014-06-30 ENCOUNTER — Ambulatory Visit (HOSPITAL_COMMUNITY)
Admission: RE | Admit: 2014-06-30 | Discharge: 2014-06-30 | Disposition: A | Discharge status: Home / Self Care | Payer: PRIVATE HEALTH INSURANCE | Source: Ambulatory Visit | Attending: Obstetrics & Gynecology | Admitting: Obstetrics & Gynecology

## 2014-06-30 DIAGNOSIS — Z3689 Encounter for other specified antenatal screening: Secondary | ICD-10-CM | POA: Insufficient documentation

## 2014-06-30 DIAGNOSIS — O4690 Antepartum hemorrhage, unspecified, unspecified trimester: Secondary | ICD-10-CM

## 2014-06-30 DIAGNOSIS — O209 Hemorrhage in early pregnancy, unspecified: Secondary | ICD-10-CM | POA: Insufficient documentation

## 2014-06-30 IMAGING — US US OB FOLLOW-UP
1 series · 12 of 28 positions shown · non-contrast
Comparison: none

[Series 1: us ob follow-up · 0.23mm/px · 12 of 45 slices shown]
[im 2/45]
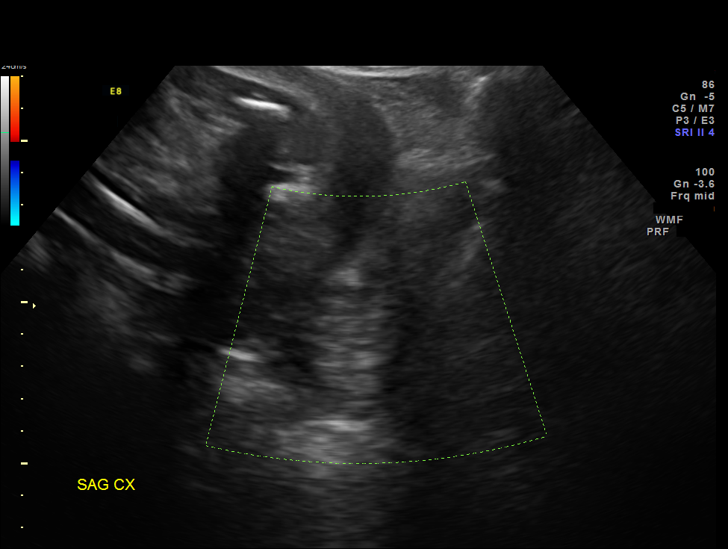
[im 5/45]
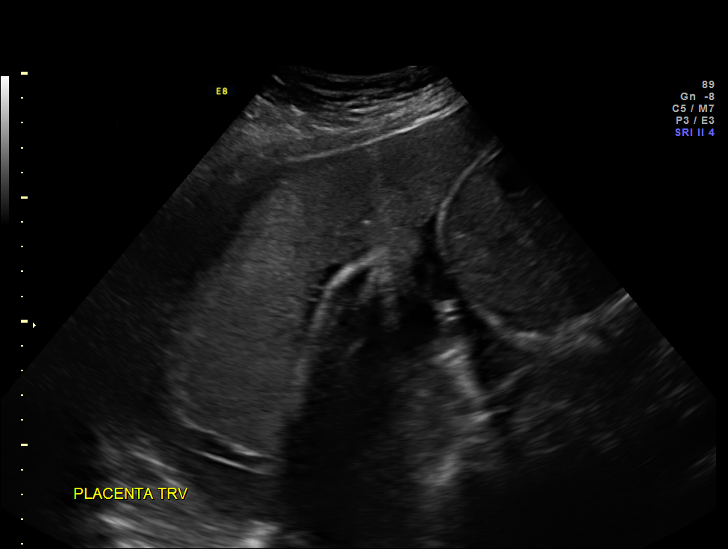
[im 9/45]
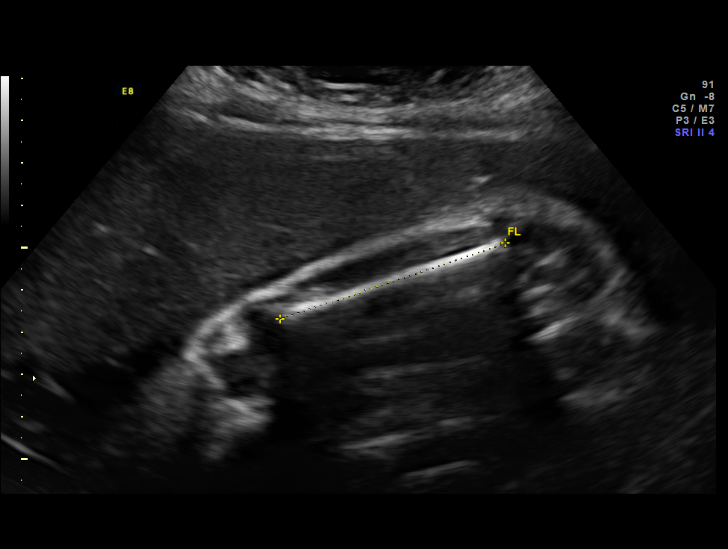
[im 14/45]
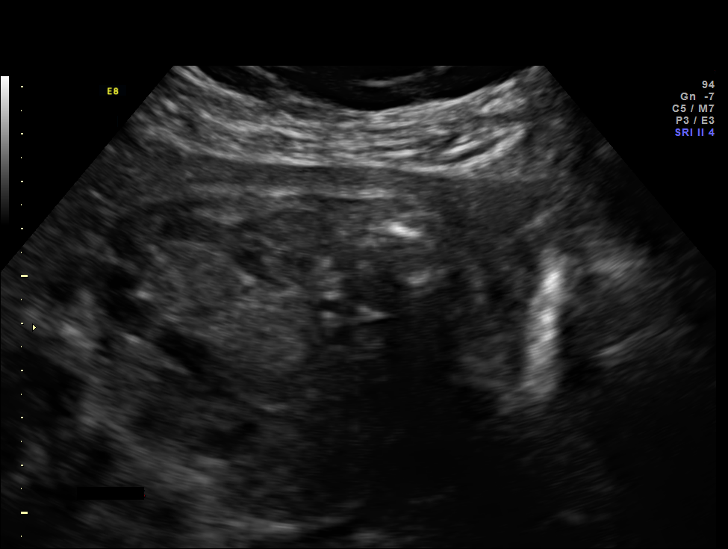
[im 17/45]
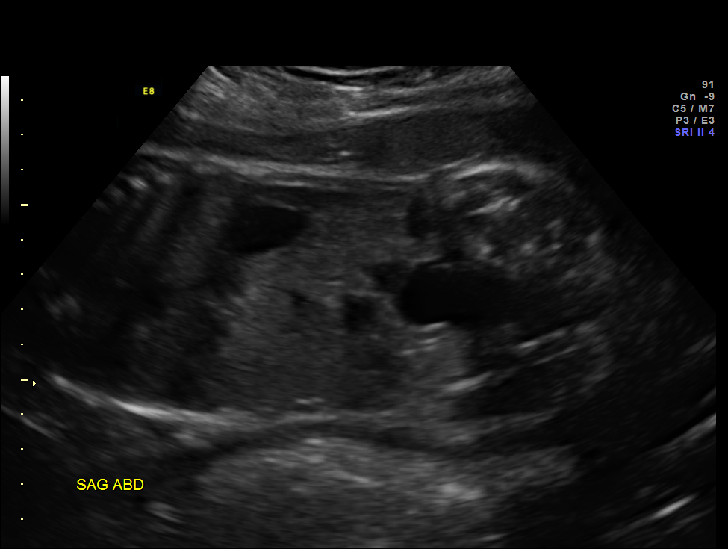
[im 20/45]
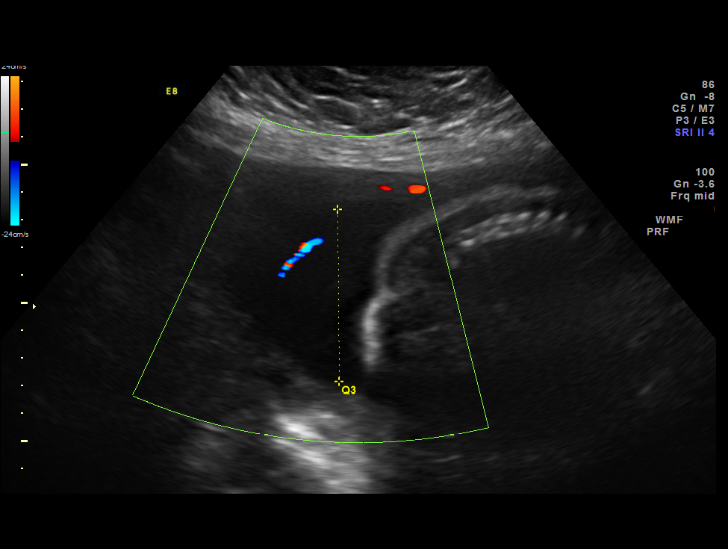
[im 25/45]
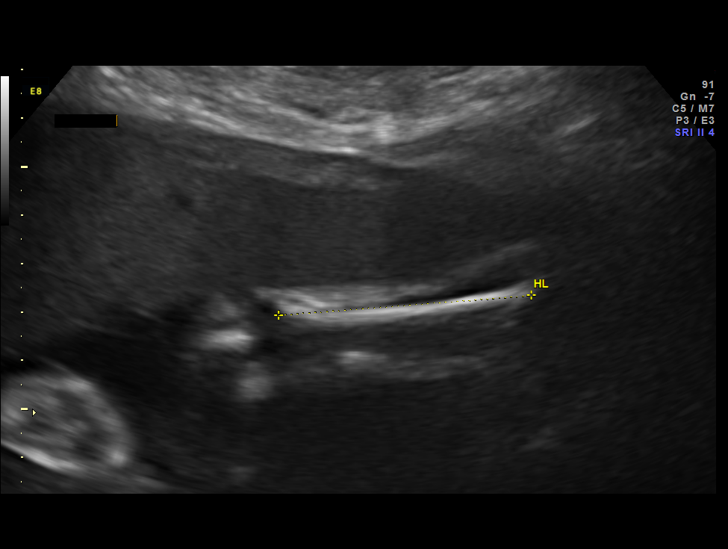
[im 28/45]
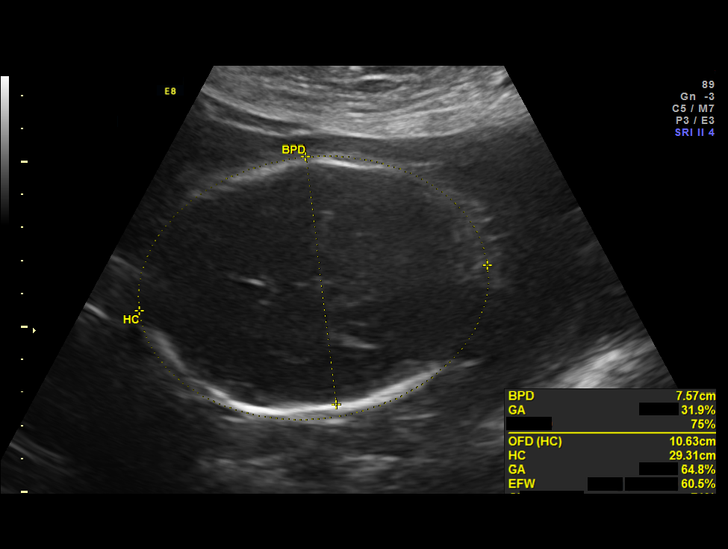
[im 31/45]
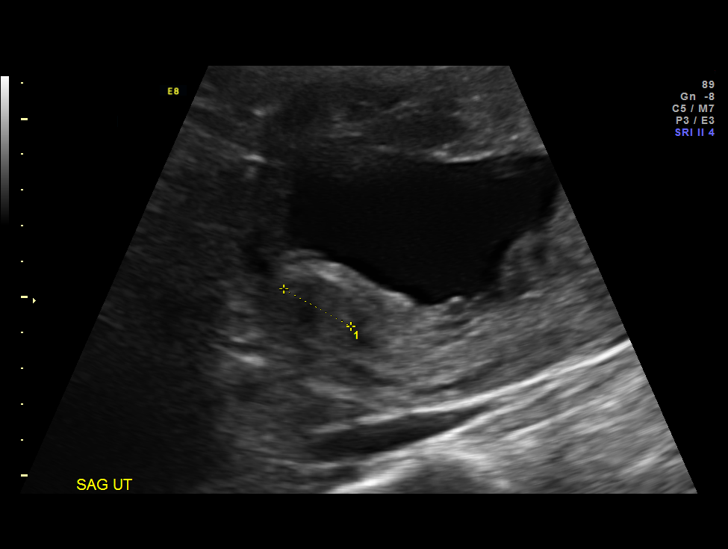
[im 36/45]
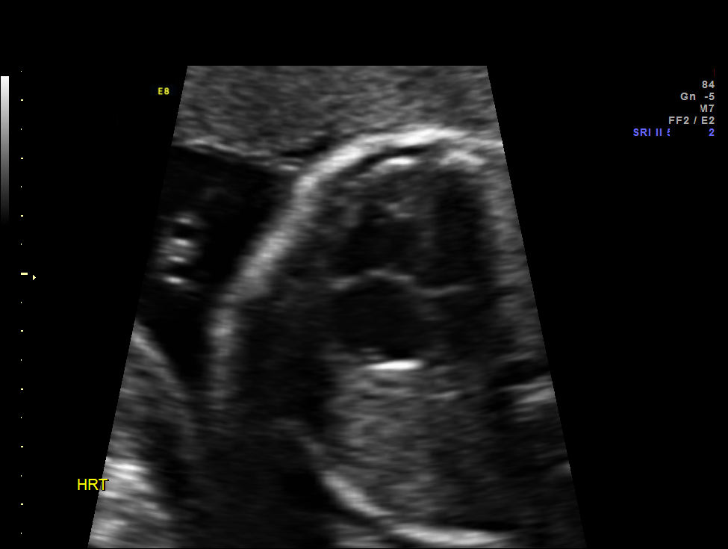
[im 40/45]
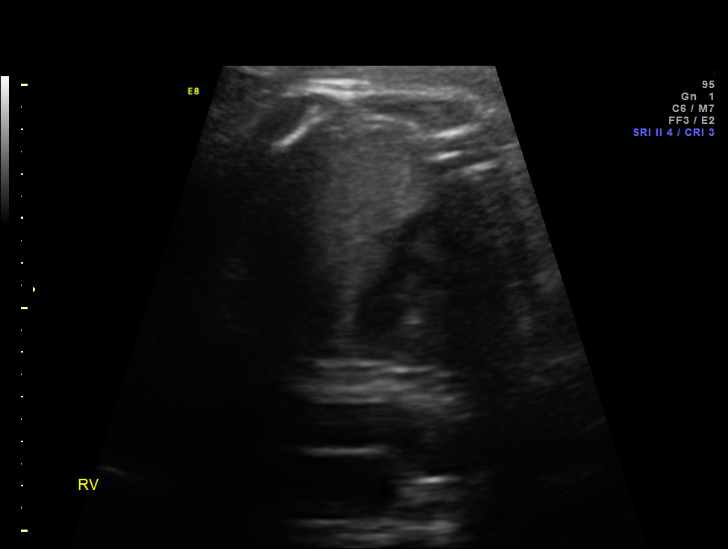
[im 43/45]
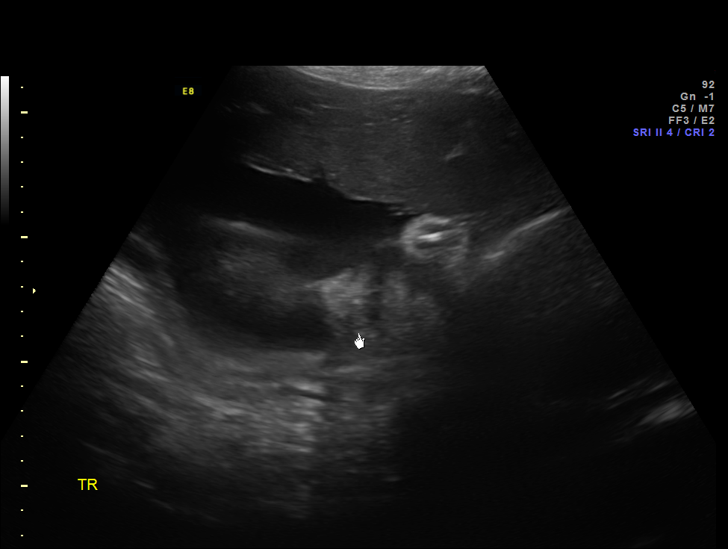

[12 of 28 positions shown; findings below may reference images not displayed]

OBSTETRICS REPORT
                      (Signed Final 06/30/2014 [DATE])

Service(s) Provided

 US OB FOLLOW UP                                       76816.1
Indications

 Follow-up incomplete fetal anatomic evaluation
 Asthma
 Cigarette smoker
 Maternal morbid obesity
Fetal Evaluation

 Num Of Fetuses:    1
 Fetal Heart Rate:  144                          bpm
 Cardiac Activity:  Observed
 Presentation:      Cephalic
 Placenta:          Anterior right lat, above
                    cervical os

 Amniotic Fluid
 AFI FV:      Subjectively within normal limits
 AFI Sum:     16.14   cm       58  %Tile     Larg Pckt:    6.22  cm
 RUQ:   2.86    cm   RLQ:    5.64   cm    LUQ:   1.42    cm   LLQ:    6.22   cm
Biometry

 BPD:     75.9  mm     G. Age:  30w 3d                CI:         72.8   70 - 86
 OFD:    104.2  mm                                    FL/HC:      19.6   19.3 -

 HC:     289.1  mm     G. Age:  31w 6d       49  %    HC/AC:      1.04   0.96 -

 AC:     276.7  mm     G. Age:  31w 5d       78  %    FL/BPD:     74.6   71 - 87
 FL:      56.6  mm     G. Age:  29w 5d       16  %    FL/AC:      20.5   20 - 24
 HUM:     51.2  mm     G. Age:  30w 0d       39  %
 Est. FW:    0185  gm    3 lb 11 oz      63  %
Gestational Age

 LMP:           29w 3d        Date:  12/06/13                 EDD:   09/12/14
 U/S Today:     30w 6d                                        EDD:   09/02/14
 Best:          30w 4d     Det. By:  Early Ultrasound         EDD:   09/04/14
                                     (02/24/14)
Anatomy
 Cranium:          Appears normal         Aortic Arch:      Previously seen
 Fetal Cavum:      Previously seen        Ductal Arch:      Previously seen
 Ventricles:       Appears normal         Diaphragm:        Previously seen
 Choroid Plexus:   Previously seen        Stomach:          Appears normal, left
                                                            sided
 Cerebellum:       Previously seen        Abdomen:          Appears normal
 Posterior Fossa:  Previously seen        Abdominal Wall:   Previously seen
 Nuchal Fold:      Not applicable (>20    Cord Vessels:     Previously seen
                   wks GA)
 Face:             Orbits nl; profile not Kidneys:          Appear normal
                   well visualized
 Lips:             Previously seen        Bladder:          Appears normal
 Heart:            Appears normal         Spine:            Previously seen
                   (4CH, axis, and
                   situs)
 RVOT:             Appears normal         Lower             Previously seen
                                          Extremities:
 LVOT:             Not well visualized    Upper             Previously seen
                                          Extremities:

 Other:  Technically difficult due to maternal habitus and fetal position.
Cervix Uterus Adnexa

 Cervix:       Not visualized (advanced GA >68wks)

 Adnexa:     No abnormality visualized.
Myomas

 Site                     L(cm)      W(cm)       D(cm)      Location
 Posterior right

 Blood Flow                  RI       PI       Comments

Impression

 SIUP at 30+4 weeks
 Normal interval anatomy; anatomic survey complete except
 for LVOT and profile
 Normal amniotic fluid volume
 Appropriate interval growth with EFW at the 63rd %tile
 Fibroid uterus: see above for size and location
Recommendations

 Follow-up ultrasound for growth in 5-6 weeks

 questions or concerns.

## 2014-07-02 ENCOUNTER — Ambulatory Visit (INDEPENDENT_AMBULATORY_CARE_PROVIDER_SITE_OTHER): Payer: PRIVATE HEALTH INSURANCE | Admitting: Obstetrics & Gynecology

## 2014-07-02 ENCOUNTER — Encounter: Payer: Self-pay | Admitting: Obstetrics & Gynecology

## 2014-07-02 VITALS — BP 127/75 | HR 95 | Temp 98.1°F | Wt 261.0 lb

## 2014-07-02 DIAGNOSIS — Z3483 Encounter for supervision of other normal pregnancy, third trimester: Secondary | ICD-10-CM

## 2014-07-02 DIAGNOSIS — Z23 Encounter for immunization: Secondary | ICD-10-CM

## 2014-07-02 LAB — POCT URINALYSIS DIPSTICK
GLUCOSE UA: NEGATIVE
KETONES UA: NEGATIVE
Leukocytes, UA: NEGATIVE
NITRITE UA: NEGATIVE
Protein, UA: NEGATIVE
RBC UA: NEGATIVE
SPEC GRAV UA: 1.02
pH, UA: 5

## 2014-07-02 MED ORDER — TETANUS-DIPHTH-ACELL PERTUSSIS 5-2.5-18.5 LF-MCG/0.5 IM SUSP
0.5000 mL | Freq: Once | INTRAMUSCULAR | Status: AC
  Administered 2014-07-02: 0.5 mL via INTRAMUSCULAR

## 2014-07-02 NOTE — Progress Notes (Signed)
Subjective:    Annette Miranda is a 33 y.o. female being seen today for her obstetrical visit. She is at [redacted]w[redacted]d gestation. Patient reports no complaints. Fetal movement: normal.  Problem List Items Addressed This Visit   Supervision of other normal pregnancy - Primary   Relevant Orders      POCT urinalysis dipstick     Patient Active Problem List   Diagnosis Date Noted  . GERD without esophagitis 06/04/2014  . Second trimester bleeding 05/09/2014  . Tobacco smoking complicating pregnancy 83/72/9021  . 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 Supervision of other normal pregnancy 01/16/2014   Objective:    BP 127/75  Pulse 95  Temp(Src) 98.1 F (36.7 C)  Wt 118.389 kg (261 lb)  LMP 12/06/2013 FHT:  140 BPM  Uterine Size: size equals dates  Presentation: unsure     Assessment:    Pregnancy @ [redacted]w[redacted]d  weeks   Plan:     labs reviewed, problem list updated  Orders Placed This Encounter  Procedures  . POCT urinalysis dipstick   Follow up in 2 Weeks.

## 2014-07-02 NOTE — Patient Instructions (Signed)
Third Trimester of Pregnancy The third trimester is from week 29 through week 42, months 7 through 9. The third trimester is a time when the fetus is growing rapidly. At the end of the ninth month, the fetus is about 20 inches in length and weighs 6-10 pounds.  BODY CHANGES Your body goes through many changes during pregnancy. The changes vary from woman to woman.   Your weight will continue to increase. You can expect to gain 25-35 pounds (11-16 kg) by the end of the pregnancy.  You may begin to get stretch marks on your hips, abdomen, and breasts.  You may urinate more often because the fetus is moving lower into your pelvis and pressing on your bladder.  You may develop or continue to have heartburn as a result of your pregnancy.  You may develop constipation because certain hormones are causing the muscles that push waste through your intestines to slow down.  You may develop hemorrhoids or swollen, bulging veins (varicose veins).  You may have pelvic pain because of the weight gain and pregnancy hormones relaxing your joints between the bones in your pelvis. Backaches may result from overexertion of the muscles supporting your posture.  You may have changes in your hair. These can include thickening of your hair, rapid growth, and changes in texture. Some women also have hair loss during or after pregnancy, or hair that feels dry or thin. Your hair will most likely return to normal after your baby is born.  Your breasts will continue to grow and be tender. A yellow discharge may leak from your breasts called colostrum.  Your belly button may stick out.  You may feel short of breath because of your expanding uterus.  You may notice the fetus "dropping," or moving lower in your abdomen.  You may have a bloody mucus discharge. This usually occurs a few days to a week before labor begins.  Your cervix becomes thin and soft (effaced) near your due date. WHAT TO EXPECT AT YOUR PRENATAL  EXAMS  You will have prenatal exams every 2 weeks until week 36. Then, you will have weekly prenatal exams. During a routine prenatal visit:  You will be weighed to make sure you and the fetus are growing normally.  Your blood pressure is taken.  Your abdomen will be measured to track your baby's growth.  The fetal heartbeat will be listened to.  Any test results from the previous visit will be discussed.  You may have a cervical check near your due date to see if you have effaced. At around 36 weeks, your caregiver will check your cervix. At the same time, your caregiver will also perform a test on the secretions of the vaginal tissue. This test is to determine if a type of bacteria, Group B streptococcus, is present. Your caregiver will explain this further. Your caregiver may ask you:  What your birth plan is.  How you are feeling.  If you are feeling the baby move.  If you have had any abnormal symptoms, such as leaking fluid, bleeding, severe headaches, or abdominal cramping.  If you have any questions. Other tests or screenings that may be performed during your third trimester include:  Blood tests that check for low iron levels (anemia).  Fetal testing to check the health, activity level, and growth of the fetus. Testing is done if you have certain medical conditions or if there are problems during the pregnancy. FALSE LABOR You may feel small, irregular contractions that   eventually go away. These are called Braxton Hicks contractions, or false labor. Contractions may last for hours, days, or even weeks before true labor sets in. If contractions come at regular intervals, intensify, or become painful, it is best to be seen by your caregiver.  SIGNS OF LABOR   Menstrual-like cramps.  Contractions that are 5 minutes apart or less.  Contractions that start on the top of the uterus and spread down to the lower abdomen and back.  A sense of increased pelvic pressure or back  pain.  A watery or bloody mucus discharge that comes from the vagina. If you have any of these signs before the 37th week of pregnancy, call your caregiver right away. You need to go to the hospital to get checked immediately. HOME CARE INSTRUCTIONS   Avoid all smoking, herbs, alcohol, and unprescribed drugs. These chemicals affect the formation and growth of the baby.  Follow your caregiver's instructions regarding medicine use. There are medicines that are either safe or unsafe to take during pregnancy.  Exercise only as directed by your caregiver. Experiencing uterine cramps is a good sign to stop exercising.  Continue to eat regular, healthy meals.  Wear a good support bra for breast tenderness.  Do not use hot tubs, steam rooms, or saunas.  Wear your seat belt at all times when driving.  Avoid raw meat, uncooked cheese, cat litter boxes, and soil used by cats. These carry germs that can cause birth defects in the baby.  Take your prenatal vitamins.  Try taking a stool softener (if your caregiver approves) if you develop constipation. Eat more high-fiber foods, such as fresh vegetables or fruit and whole grains. Drink plenty of fluids to keep your urine clear or pale yellow.  Take warm sitz baths to soothe any pain or discomfort caused by hemorrhoids. Use hemorrhoid cream if your caregiver approves.  If you develop varicose veins, wear support hose. Elevate your feet for 15 minutes, 3-4 times a day. Limit salt in your diet.  Avoid heavy lifting, wear low heal shoes, and practice good posture.  Rest a lot with your legs elevated if you have leg cramps or low back pain.  Visit your dentist if you have not gone during your pregnancy. Use a soft toothbrush to brush your teeth and be gentle when you floss.  A sexual relationship may be continued unless your caregiver directs you otherwise.  Do not travel far distances unless it is absolutely necessary and only with the approval  of your caregiver.  Take prenatal classes to understand, practice, and ask questions about the labor and delivery.  Make a trial run to the hospital.  Pack your hospital bag.  Prepare the baby's nursery.  Continue to go to all your prenatal visits as directed by your caregiver. SEEK MEDICAL CARE IF:  You are unsure if you are in labor or if your water has broken.  You have dizziness.  You have mild pelvic cramps, pelvic pressure, or nagging pain in your abdominal area.  You have persistent nausea, vomiting, or diarrhea.  You have a bad smelling vaginal discharge.  You have pain with urination. SEEK IMMEDIATE MEDICAL CARE IF:   You have a fever.  You are leaking fluid from your vagina.  You have spotting or bleeding from your vagina.  You have severe abdominal cramping or pain.  You have rapid weight loss or gain.  You have shortness of breath with chest pain.  You notice sudden or extreme swelling   of your face, hands, ankles, feet, or legs.  You have not felt your baby move in over an hour.  You have severe headaches that do not go away with medicine.  You have vision changes. Document Released: 11/28/2001 Document Revised: 12/09/2013 Document Reviewed: 02/04/2013 [employer] Patient Information 2015 [employer]. This information is not intended to replace advice given to you by your health care provider. Make sure you discuss any questions you have with your health care provider.  

## 2014-07-07 ENCOUNTER — Other Ambulatory Visit (HOSPITAL_COMMUNITY): Payer: Self-pay | Admitting: Maternal and Fetal Medicine

## 2014-07-07 DIAGNOSIS — O4692 Antepartum hemorrhage, unspecified, second trimester: Secondary | ICD-10-CM

## 2014-07-23 ENCOUNTER — Ambulatory Visit (INDEPENDENT_AMBULATORY_CARE_PROVIDER_SITE_OTHER): Payer: PRIVATE HEALTH INSURANCE | Admitting: Obstetrics & Gynecology

## 2014-07-23 ENCOUNTER — Encounter: Payer: Self-pay | Admitting: Obstetrics & Gynecology

## 2014-07-23 VITALS — BP 134/82 | HR 79 | Temp 97.7°F | Wt 265.0 lb

## 2014-07-23 DIAGNOSIS — Z3483 Encounter for supervision of other normal pregnancy, third trimester: Secondary | ICD-10-CM

## 2014-07-23 DIAGNOSIS — Z3480 Encounter for supervision of other normal pregnancy, unspecified trimester: Secondary | ICD-10-CM

## 2014-07-23 LAB — POCT URINALYSIS DIPSTICK
Blood, UA: NEGATIVE
Glucose, UA: NEGATIVE
KETONES UA: NEGATIVE
Nitrite, UA: NEGATIVE
Protein, UA: 1
Spec Grav, UA: 1.02
pH, UA: 5

## 2014-07-23 NOTE — Progress Notes (Signed)
Subjective:    Annette Miranda is a 33 y.o. female being seen today for her obstetrical visit. She is at [redacted]w[redacted]d gestation. Patient reports no complaints. Fetal movement: normal.  Problem List Items Addressed This Visit     Unprioritized   Supervision of other normal pregnancy - Primary   Relevant Orders      POCT urinalysis dipstick     Patient Active Problem List   Diagnosis Date Noted  . GERD without esophagitis 06/04/2014  . Second trimester bleeding 05/09/2014  . Tobacco smoking complicating pregnancy 47/65/4650  . 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 Supervision of other normal pregnancy 01/16/2014   Objective:    BP 134/80  Pulse 79  Temp(Src) 97.7 F (36.5 C)  Wt 120.203 kg (265 lb)  LMP 12/06/2013 FHT:  140 BPM  Uterine Size: size equals dates  Presentation: unsure     Assessment:    Pregnancy @ [redacted]w[redacted]d  weeks   Plan:     labs reviewed, problem list updated  Orders Placed This Encounter  Procedures  . POCT urinalysis dipstick   Follow up in 1 Weeks.

## 2014-07-24 LAB — CBC
HCT: 36 % (ref 36.0–46.0)
HEMOGLOBIN: 12.3 g/dL (ref 12.0–15.0)
MCH: 31.2 pg (ref 26.0–34.0)
MCHC: 34.2 g/dL (ref 30.0–36.0)
MCV: 91.4 fL (ref 78.0–100.0)
Platelets: 137 10*3/uL — ABNORMAL LOW (ref 150–400)
RBC: 3.94 MIL/uL (ref 3.87–5.11)
RDW: 14 % (ref 11.5–15.5)
WBC: 7.4 10*3/uL (ref 4.0–10.5)

## 2014-07-24 LAB — CREATININE, SERUM: CREATININE: 0.46 mg/dL — AB (ref 0.50–1.10)

## 2014-07-24 LAB — PATHOLOGIST SMEAR REVIEW

## 2014-07-24 LAB — PROTEIN / CREATININE RATIO, URINE
Creatinine, Urine: 147.7 mg/dL
Protein Creatinine Ratio: 0.07 (ref <0.15)
TOTAL PROTEIN, URINE: 10 mg/dL

## 2014-07-24 LAB — AST: AST: 19 U/L (ref 0–37)

## 2014-07-24 LAB — ALT: ALT: 17 U/L (ref 0–35)

## 2014-07-24 LAB — LACTATE DEHYDROGENASE: LDH: 141 U/L (ref 94–250)

## 2014-07-25 NOTE — Patient Instructions (Signed)

## 2014-07-29 ENCOUNTER — Telehealth: Payer: Self-pay

## 2014-07-29 NOTE — Telephone Encounter (Signed)
TOLD PATIENT FMLA PAPERS WERE READY FOR PICK-UP AND WOULD BE A 15.00 CHARGE

## 2014-07-30 DIAGNOSIS — Z0289 Encounter for other administrative examinations: Secondary | ICD-10-CM

## 2014-08-06 ENCOUNTER — Ambulatory Visit (INDEPENDENT_AMBULATORY_CARE_PROVIDER_SITE_OTHER): Payer: PRIVATE HEALTH INSURANCE | Admitting: Obstetrics & Gynecology

## 2014-08-06 ENCOUNTER — Encounter: Payer: Self-pay | Admitting: Obstetrics & Gynecology

## 2014-08-06 VITALS — BP 120/86 | Temp 98.2°F | Wt 271.0 lb

## 2014-08-06 DIAGNOSIS — Z3483 Encounter for supervision of other normal pregnancy, third trimester: Secondary | ICD-10-CM

## 2014-08-06 DIAGNOSIS — Z3480 Encounter for supervision of other normal pregnancy, unspecified trimester: Secondary | ICD-10-CM

## 2014-08-06 LAB — POCT URINALYSIS DIPSTICK
BILIRUBIN UA: NEGATIVE
GLUCOSE UA: NEGATIVE
Ketones, UA: NEGATIVE
Leukocytes, UA: NEGATIVE
Nitrite, UA: NEGATIVE
RBC UA: NEGATIVE
Spec Grav, UA: 1.025
Urobilinogen, UA: NEGATIVE
pH, UA: 5

## 2014-08-06 NOTE — Addendum Note (Signed)
Addended by: Ladona Ridgel on: 08/06/2014 12:03 PM   Modules accepted: Orders

## 2014-08-06 NOTE — Progress Notes (Signed)
Subjective:    Annette Miranda is a 33 y.o. female being seen today for her obstetrical visit. She is at [redacted]w[redacted]d gestation. Patient reports no complaints. Fetal movement: normal.  Problem List Items Addressed This Visit   None     Patient Active Problem List   Diagnosis Date Noted  . GERD without esophagitis 06/04/2014  . Second trimester bleeding 05/09/2014  . Tobacco smoking complicating pregnancy 30/86/5784  . 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 Supervision of other normal pregnancy 01/16/2014   Objective:    BP 120/86  Temp(Src) 98.2 F (36.8 C)  Wt 122.925 kg (271 lb)  LMP 12/06/2013 FHT:  140 BPM  Uterine Size: size equals dates  Presentation: unsure     Assessment:    Pregnancy @ [redacted]w[redacted]d  weeks   Plan:     labs reviewed, problem list updated Follow up in 1 Week.

## 2014-08-06 NOTE — Addendum Note (Signed)
Addended by: Valli Glance F on: 08/06/2014 02:47 PM   Modules accepted: Orders

## 2014-08-06 NOTE — Patient Instructions (Signed)

## 2014-08-07 LAB — STREP B DNA PROBE: GBSP: NOT DETECTED

## 2014-08-12 ENCOUNTER — Ambulatory Visit (HOSPITAL_COMMUNITY)
Admission: RE | Admit: 2014-08-12 | Discharge: 2014-08-12 | Disposition: A | Discharge status: Home / Self Care | Payer: PRIVATE HEALTH INSURANCE | Source: Ambulatory Visit | Attending: Obstetrics & Gynecology | Admitting: Obstetrics & Gynecology

## 2014-08-12 VITALS — BP 124/77 | HR 97 | Wt 273.5 lb

## 2014-08-12 DIAGNOSIS — O358XX0 Maternal care for other (suspected) fetal abnormality and damage, not applicable or unspecified: Secondary | ICD-10-CM | POA: Diagnosis not present

## 2014-08-12 DIAGNOSIS — O4690 Antepartum hemorrhage, unspecified, unspecified trimester: Secondary | ICD-10-CM | POA: Insufficient documentation

## 2014-08-12 DIAGNOSIS — O99210 Obesity complicating pregnancy, unspecified trimester: Secondary | ICD-10-CM | POA: Diagnosis not present

## 2014-08-12 DIAGNOSIS — E669 Obesity, unspecified: Secondary | ICD-10-CM

## 2014-08-12 DIAGNOSIS — O4692 Antepartum hemorrhage, unspecified, second trimester: Secondary | ICD-10-CM

## 2014-08-12 IMAGING — US US OB FOLLOW-UP
1 series · 12 of 28 positions shown · non-contrast
Comparison: none

[Series 1: us ob follow-up · 0.35mm/px · 12 of 31 slices shown]
[im 2/31]
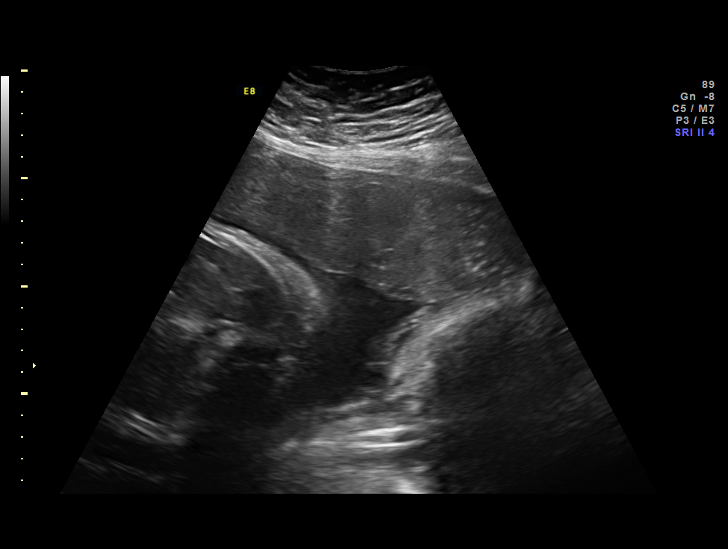
[im 4/31]
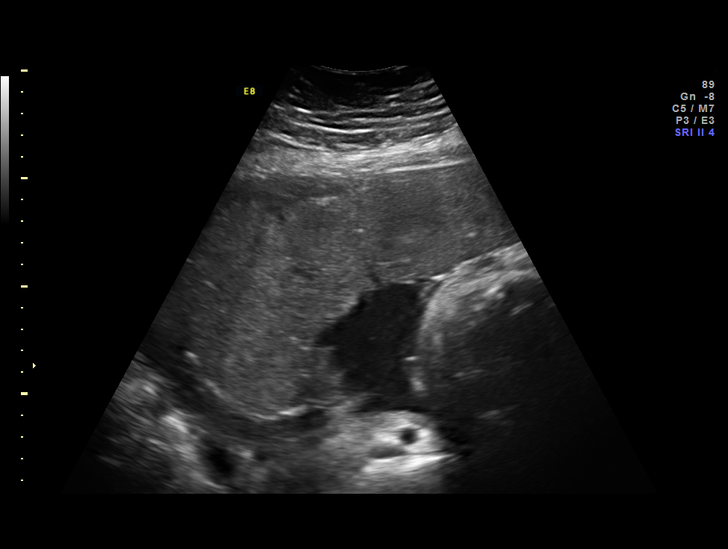
[im 6/31]
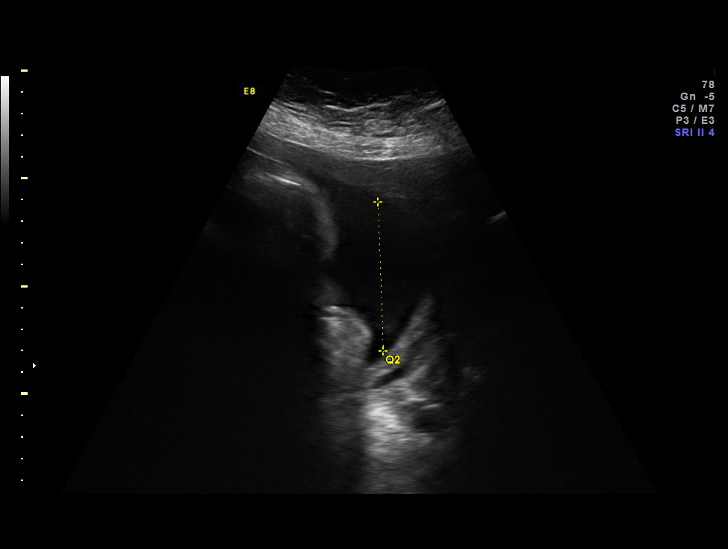
[im 9/31]
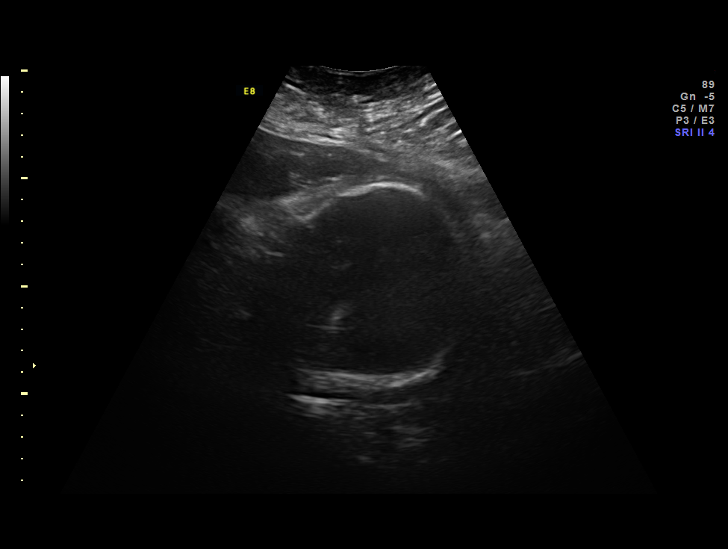
[im 12/31]
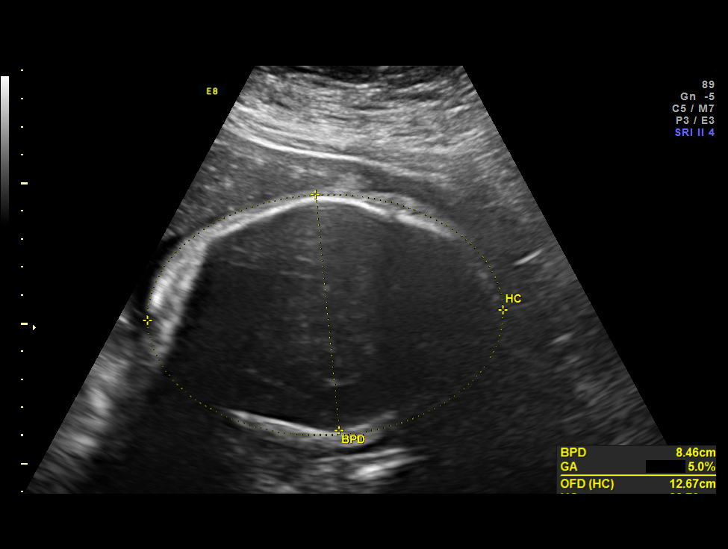
[im 14/31]
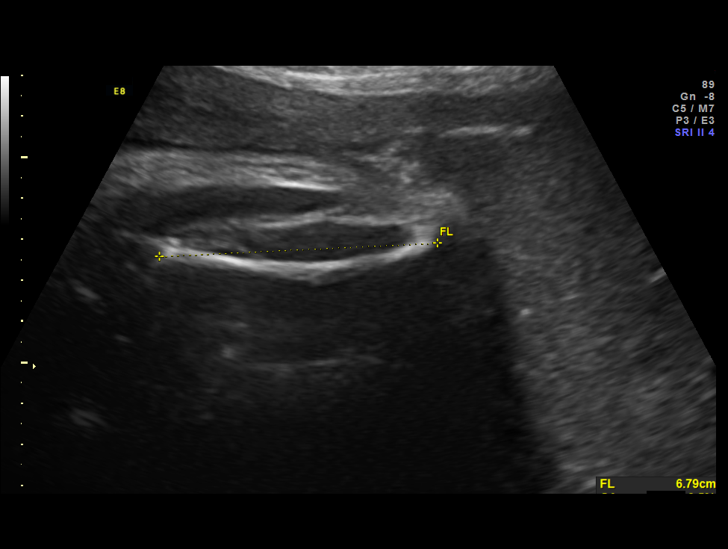
[im 17/31]
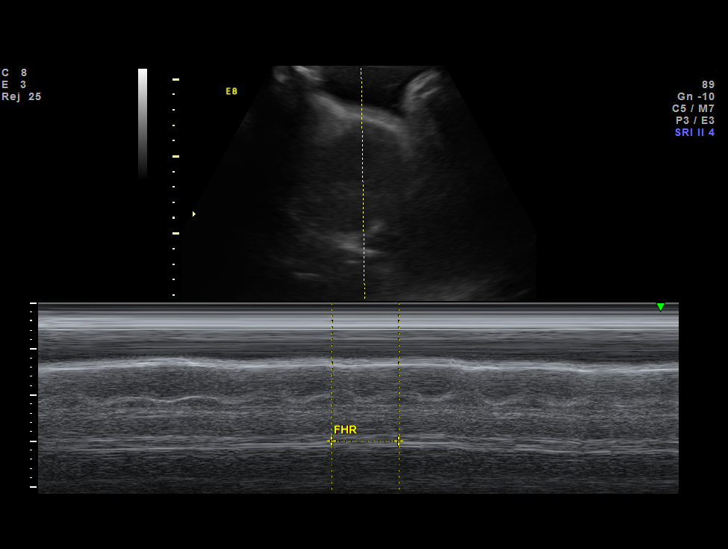
[im 19/31]
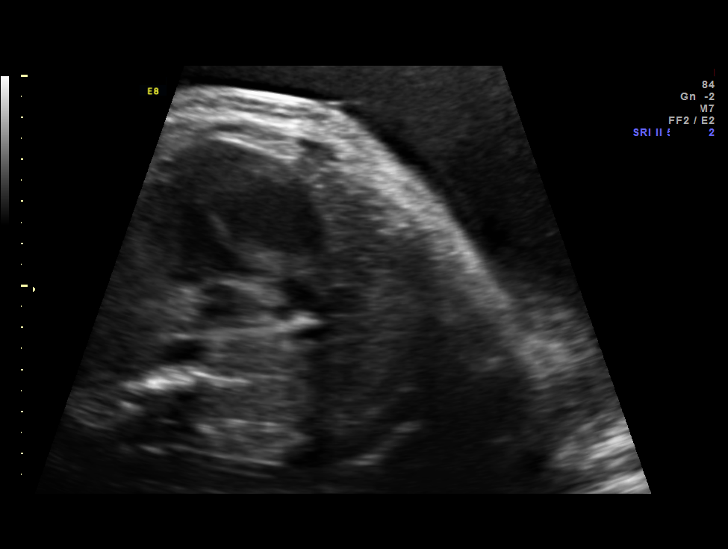
[im 22/31]
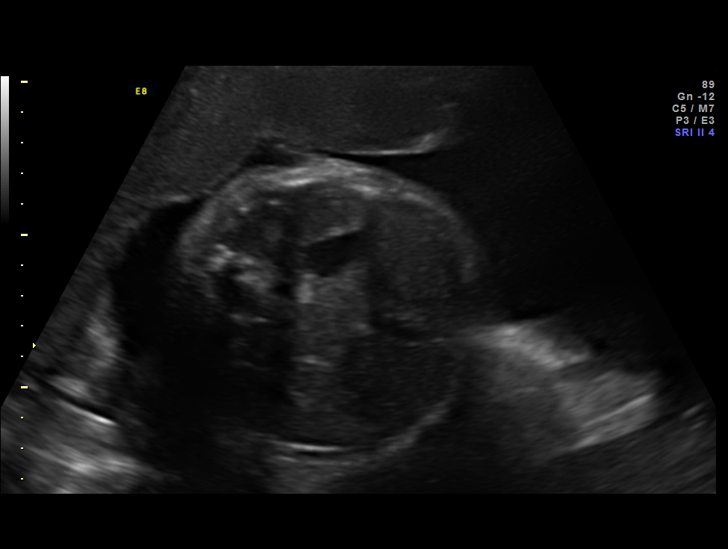
[im 25/31]
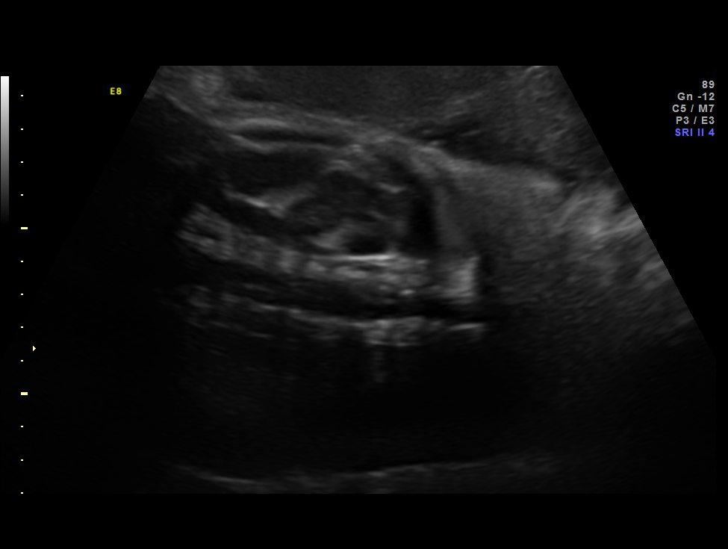
[im 27/31]
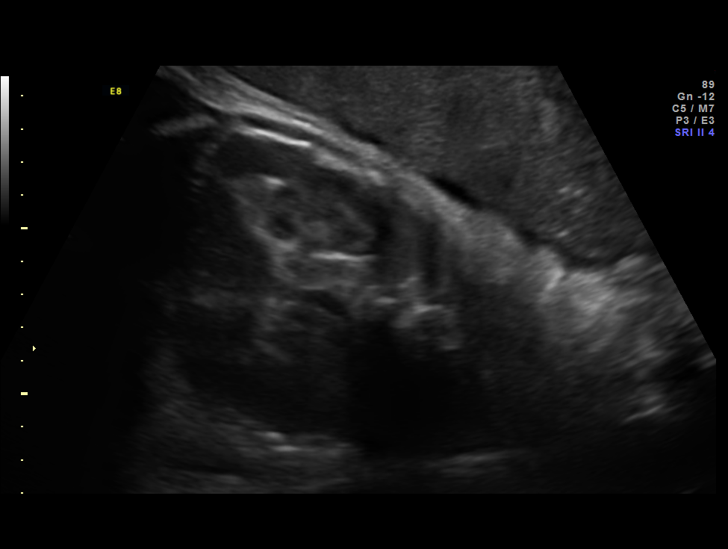
[im 29/31]
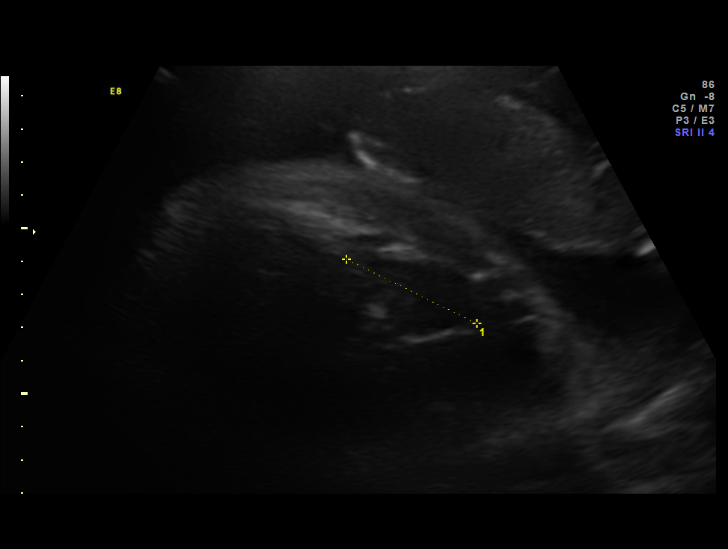

[12 of 28 positions shown; findings below may reference images not displayed]

OBSTETRICS REPORT
                      (Signed Final 08/12/2014 [DATE])

Service(s) Provided

 US OB FOLLOW UP                                       76816.1
Indications

 Asthma
 Cigarette smoker
 Maternal morbid obesity
 Follow-up incomplete fetal anatomic evaluation
Fetal Evaluation

 Num Of Fetuses:    1
 Fetal Heart Rate:  150                          bpm
 Cardiac Activity:  Observed
 Presentation:      Cephalic
 Placenta:          Anterior right lat, above
                    cervical os

 Amniotic Fluid
 AFI FV:      Subjectively within normal limits
 AFI Sum:     21.17   cm       81  %Tile     Larg Pckt:    6.92  cm
 RUQ:   4.7     cm   RLQ:    5.19   cm    LUQ:   6.92    cm   LLQ:    4.36   cm
Biometry

 BPD:     85.7  mm     G. Age:  34w 4d                CI:         69.7   70 - 86
 OFD:      123  mm                                    FL/HC:      20.5   20.8 -

 HC:       332  mm     G. Age:  37w 6d       51  %    HC/AC:      0.98   0.92 -

 AC:     338.2  mm     G. Age:  37w 5d       85  %    FL/BPD:     79.3   71 - 87
 FL:        68  mm     G. Age:  34w 6d       10  %    FL/AC:      20.1   20 - 24
 HUM:     59.4  mm     G. Age:  34w 3d       26  %

 Est. FW:    1996  gm    6 lb 10 oz      67  %
Gestational Age

 LMP:           35w 4d        Date:  12/06/13                 EDD:   09/12/14
 U/S Today:     36w 2d                                        EDD:   09/07/14
 Best:          36w 5d     Det. By:  Early Ultrasound         EDD:   09/04/14
                                     (02/24/14)
Anatomy
 Cranium:          Previously seen        Aortic Arch:      Previously seen
 Fetal Cavum:      Previously seen        Ductal Arch:      Previously seen
 Ventricles:       Appears normal         Diaphragm:        Appears normal
 Choroid Plexus:   Previously seen        Stomach:          Appears normal, left
                                                            sided
 Cerebellum:       Previously seen        Abdomen:          Previously seen
 Posterior Fossa:  Previously seen        Abdominal Wall:   Previously seen
 Nuchal Fold:      Not applicable (>20    Cord Vessels:     Previously seen
                   wks GA)
 Face:             Orbits nl; profile not Kidneys:          Appear normal
                   well visualized
 Lips:             Previously seen        Bladder:          Appears normal
 Heart:            Appears normal         Spine:            Previously seen
                   (4CH, axis, and
                   situs)
 RVOT:             Previously seen        Lower             Previously seen
                                          Extremities:
 LVOT:             Appears normal         Upper             Previously seen
                                          Extremities:
Cervix Uterus Adnexa

 Cervix:       Not visualized (advanced GA >87wks)
Impression

 SIUP at 36+5 weeks
 Normal interval anatomy; anatomic survey complete except
 for profile
 Normal amniotic fluid volume
 Appropriate interval growth with EFW at the 67th %tile
Recommendations

 Follow-up as clinically indicated

 questions or concerns.

## 2014-08-17 ENCOUNTER — Encounter: Payer: PRIVATE HEALTH INSURANCE | Admitting: Obstetrics & Gynecology

## 2014-08-17 ENCOUNTER — Encounter: Payer: Self-pay | Admitting: Obstetrics & Gynecology

## 2014-08-17 ENCOUNTER — Ambulatory Visit (INDEPENDENT_AMBULATORY_CARE_PROVIDER_SITE_OTHER): Payer: PRIVATE HEALTH INSURANCE | Admitting: Obstetrics & Gynecology

## 2014-08-17 VITALS — BP 130/86 | Temp 98.3°F | Wt 274.0 lb

## 2014-08-17 DIAGNOSIS — Z23 Encounter for immunization: Secondary | ICD-10-CM

## 2014-08-17 DIAGNOSIS — Z3483 Encounter for supervision of other normal pregnancy, third trimester: Secondary | ICD-10-CM

## 2014-08-17 DIAGNOSIS — Z3480 Encounter for supervision of other normal pregnancy, unspecified trimester: Secondary | ICD-10-CM

## 2014-08-17 LAB — POCT URINALYSIS DIPSTICK
BILIRUBIN UA: NEGATIVE
Blood, UA: NEGATIVE
Glucose, UA: NEGATIVE
KETONES UA: NEGATIVE
LEUKOCYTES UA: NEGATIVE
Nitrite, UA: NEGATIVE
PH UA: 5
Spec Grav, UA: 1.025
Urobilinogen, UA: NEGATIVE

## 2014-08-18 NOTE — Patient Instructions (Signed)

## 2014-08-18 NOTE — Progress Notes (Signed)
Subjective:    Annette Miranda is a 33 y.o. female being seen today for her obstetrical visit. She is at [redacted]w[redacted]d gestation. Patient reports no complaints. Fetal movement: normal.  Problem List Items Addressed This Visit   None    Visit Diagnoses   Need for immunization against influenza    -  Primary    Relevant Orders       Flu Vaccine QUAD 36+ mos IM (Fluarix) (Completed)      Patient Active Problem List   Diagnosis Date Noted  . GERD without esophagitis 06/04/2014  . Second trimester bleeding 05/09/2014  . Tobacco smoking complicating pregnancy 63/87/5643  . 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 Supervision of other normal pregnancy 01/16/2014    Objective:    BP 130/86  Temp(Src) 98.3 F (36.8 C)  Wt 124.286 kg (274 lb)  LMP 12/06/2013 FHT: 140 BPM  Uterine Size: size equals dates  Presentations: cephalic     Assessment:    Pregnancy @ [redacted]w[redacted]d weeks   Plan:   Plans for delivery: Vaginal anticipated; labs reviewed; problem list updated Counseling: Consent signed. Cigarette smoking: former L&D discussion: symptoms of labor, discussed when to call, discussed what number to call, anesthetic/analgesic options reviewed and delivering clinician Postpartum supports and preparation: circumcision discussed and contraception plans discussed.  Follow up in 1 Week.

## 2014-08-21 NOTE — Addendum Note (Signed)
Addended by: Ladona Ridgel on: 08/21/2014 09:54 AM   Modules accepted: Orders

## 2014-08-22 ENCOUNTER — Inpatient Hospital Stay (HOSPITAL_COMMUNITY)
Admission: AD | Admit: 2014-08-22 | Discharge: 2014-08-22 | Disposition: A | Discharge status: Home / Self Care | Payer: PRIVATE HEALTH INSURANCE | Source: Ambulatory Visit | Attending: Obstetrics | Admitting: Obstetrics

## 2014-08-22 ENCOUNTER — Encounter (HOSPITAL_COMMUNITY): Payer: Self-pay

## 2014-08-22 DIAGNOSIS — M79609 Pain in unspecified limb: Secondary | ICD-10-CM

## 2014-08-22 DIAGNOSIS — O139 Gestational [pregnancy-induced] hypertension without significant proteinuria, unspecified trimester: Secondary | ICD-10-CM | POA: Diagnosis not present

## 2014-08-22 DIAGNOSIS — M7989 Other specified soft tissue disorders: Secondary | ICD-10-CM

## 2014-08-22 DIAGNOSIS — Z87891 Personal history of nicotine dependence: Secondary | ICD-10-CM | POA: Insufficient documentation

## 2014-08-22 DIAGNOSIS — O1203 Gestational edema, third trimester: Secondary | ICD-10-CM

## 2014-08-22 DIAGNOSIS — O133 Gestational [pregnancy-induced] hypertension without significant proteinuria, third trimester: Secondary | ICD-10-CM

## 2014-08-22 DIAGNOSIS — IMO0002 Reserved for concepts with insufficient information to code with codable children: Secondary | ICD-10-CM | POA: Diagnosis present

## 2014-08-22 LAB — CBC
HCT: 37 % (ref 36.0–46.0)
Hemoglobin: 13.1 g/dL (ref 12.0–15.0)
MCH: 32.3 pg (ref 26.0–34.0)
MCHC: 35.4 g/dL (ref 30.0–36.0)
MCV: 91.1 fL (ref 78.0–100.0)
PLATELETS: 121 10*3/uL — AB (ref 150–400)
RBC: 4.06 MIL/uL (ref 3.87–5.11)
RDW: 13.7 % (ref 11.5–15.5)
WBC: 7 10*3/uL (ref 4.0–10.5)

## 2014-08-22 LAB — COMPREHENSIVE METABOLIC PANEL
ALBUMIN: 2.7 g/dL — AB (ref 3.5–5.2)
ALT: 18 U/L (ref 0–35)
AST: 23 U/L (ref 0–37)
Alkaline Phosphatase: 108 U/L (ref 39–117)
Anion gap: 16 — ABNORMAL HIGH (ref 5–15)
BUN: 10 mg/dL (ref 6–23)
CALCIUM: 8.7 mg/dL (ref 8.4–10.5)
CHLORIDE: 104 meq/L (ref 96–112)
CO2: 18 mEq/L — ABNORMAL LOW (ref 19–32)
CREATININE: 0.45 mg/dL — AB (ref 0.50–1.10)
GFR calc Af Amer: >90 mL/min (ref >90)
GFR calc non Af Amer: >90 mL/min (ref >90)
Glucose, Bld: 100 mg/dL — ABNORMAL HIGH (ref 70–99)
Potassium: 3.8 mEq/L (ref 3.7–5.3)
Sodium: 138 mEq/L (ref 137–147)
Total Bilirubin: 0.3 mg/dL (ref 0.3–1.2)
Total Protein: 6.5 g/dL (ref 6.0–8.3)

## 2014-08-22 LAB — LACTATE DEHYDROGENASE: LDH: 155 U/L (ref 94–250)

## 2014-08-22 LAB — URINALYSIS, ROUTINE W REFLEX MICROSCOPIC
Bilirubin Urine: NEGATIVE
Glucose, UA: NEGATIVE mg/dL
Hgb urine dipstick: NEGATIVE
Ketones, ur: NEGATIVE mg/dL
LEUKOCYTES UA: NEGATIVE
NITRITE: NEGATIVE
PH: 5.5 (ref 5.0–8.0)
Protein, ur: NEGATIVE mg/dL
Urobilinogen, UA: 0.2 mg/dL (ref 0.0–1.0)

## 2014-08-22 LAB — PROTEIN / CREATININE RATIO, URINE
CREATININE, URINE: 160.02 mg/dL
PROTEIN CREATININE RATIO: 0.08 (ref 0.00–0.15)
TOTAL PROTEIN, URINE: 13.5 mg/dL

## 2014-08-22 LAB — URIC ACID: URIC ACID, SERUM: 4.8 mg/dL (ref 2.4–7.0)

## 2014-08-22 MED ORDER — ACETAMINOPHEN 500 MG PO TABS: 1000.0000 mg | ORAL_TABLET | Freq: Once | ORAL | Status: DC

## 2014-08-22 NOTE — Discharge Instructions (Signed)
Hypertension During Pregnancy Hypertension is also called high blood pressure. Blood pressure moves blood in your body. Sometimes, the force that moves the blood becomes too strong. When you are pregnant, this condition should be watched carefully. It can cause problems for you and your baby. HOME CARE   Make and keep all of your doctor visits.  Take medicine as told by your doctor. Tell your doctor about all medicines you take.  Eat very little salt.  Exercise regularly.  Do not drink alcohol.  Do not smoke.  Do not have drinks with caffeine.  Lie on your left side when resting.  Your health care provider may ask you to take one low-dose aspirin (81mg ) each day. GET HELP RIGHT AWAY IF:  You have bad belly (abdominal) pain.  You have sudden puffiness (swelling) in the hands, ankles, or face.  You gain 4 pounds (1.8 kilograms) or more in 1 week.  You throw up (vomit) repeatedly.  You have bleeding from the vagina.  You do not feel the baby moving as much.  You have a headache.  You have blurred or double vision.  You have muscle twitching or spasms.  You have shortness of breath.  You have blue fingernails and lips.  You have blood in your pee (urine). MAKE SURE YOU:  Understand these instructions.  Will watch your condition.  Will get help right away if you are not doing well or get worse. Document Released: 01/06/2011 Document Revised: 04/20/2014 Document Reviewed: 07/03/2013 [facility] Patient Information 2015 [place], [place]. This information is not intended to replace advice given to you by your health care provider. Make sure you discuss any questions you have with your health care provider.  Peripheral Edema You have swelling in your legs (peripheral edema). This swelling is due to excess accumulation of salt and water in your body. Edema may be a sign of heart, kidney or liver disease, or a side effect of a medication. It may also be due to problems in  the leg veins. Elevating your legs and using special support stockings may be very helpful, if the cause of the swelling is due to poor venous circulation. Avoid long periods of standing, whatever the cause. Treatment of edema depends on identifying the cause. Chips, pretzels, pickles and other salty foods should be avoided. Restricting salt in your diet is almost always needed. Water pills (diuretics) are often used to remove the excess salt and water from your body via urine. These medicines prevent the kidney from reabsorbing sodium. This increases urine flow. Diuretic treatment may also result in lowering of potassium levels in your body. Potassium supplements may be needed if you have to use diuretics daily. Daily weights can help you keep track of your progress in clearing your edema. You should call your caregiver for follow up care as recommended. SEEK IMMEDIATE MEDICAL CARE IF:   You have increased swelling, pain, redness, or heat in your legs.  You develop shortness of breath, especially when lying down.  You develop chest or abdominal pain, weakness, or fainting.  You have a fever. Document Released: 01/11/2005 Document Revised: 02/26/2012 Document Reviewed: 12/22/2009 [facility] Patient Information 2015 [place], [place]. This information is not intended to replace advice given to you by your health care provider. Make sure you discuss any questions you have with your health care provider.

## 2014-08-22 NOTE — Progress Notes (Signed)
VASCULAR LAB PRELIMINARY  PRELIMINARY  PRELIMINARY  PRELIMINARY  Left lower extremity venous Doppler completed.    Preliminary report:  There is no DVT or SVT noted in the left lower extremity.   Jolynne Spurgin, RVT 08/22/2014, 8:15 PM

## 2014-08-22 NOTE — MAU Note (Signed)
Pt states was told to come in yesterday for left leg only swelling. Thought it would get better, however seemed to get worse today. Now also notices rash on back of her ankle. No bleeding or vaginal d/c changes.

## 2014-08-22 NOTE — MAU Provider Note (Signed)
History     CSN: [id number]  Arrival date and time: 08/22/14 1620   None     Chief Complaint  Patient presents with  . Leg Swelling   HPI  [name] is a 33 y.o.female G3P2002 at 29w1dwho presents with leg swelling in the left leg, "it is also very painful". She was out shopping most of the day yesterday and noticed the symptoms after. She kept her feet propped up most of the night without relief. When she is up walking around she rates her pain 7/10 in her left leg. She feels the swelling in her left leg is worse than the right leg. She denies pain in her right leg.   OB History   Grav Para Term Preterm Abortions TAB SAB Ect Mult Living   3 2 2       2       History reviewed. No pertinent past medical history.  Past Surgical History  Procedure Laterality Date  . Cholecystectomy      Family History  Problem Relation Age of Onset  . Hypertension Mother   . Hypertension Father   . Hypertension Maternal Grandmother   . Cancer Maternal Grandmother   . Hypertension Maternal Grandfather   . Hypertension Paternal Grandmother   . Hypertension Paternal Grandfather   . Cancer Paternal Grandfather     History  Substance Use Topics  . Smoking status: Former Smoker    Types: Cigarettes    Quit date: 02/13/2014  . Smokeless tobacco: Not on file     Comment: trying to quit  . Alcohol Use: No    Allergies: No Known Allergies  Prescriptions prior to admission  Medication Sig Dispense Refill  . calcium carbonate (TUMS - DOSED IN MG ELEMENTAL CALCIUM) 500 MG chewable tablet Chew 1 tablet by mouth daily as needed for indigestion or heartburn.      . fluticasone (FLONASE) 50 MCG/ACT nasal spray Place 1 spray into both nostrils daily as needed for allergies.       . Prenatal Vit-Fe Fumarate-FA (PRENATAL MULTIVITAMIN) TABS tablet Take 1 tablet by mouth daily at 12 noon.       Results for orders placed during the hospital encounter of 08/22/14 (from the past 48 hour(s))    URINALYSIS, ROUTINE W REFLEX MICROSCOPIC     Status: Abnormal   Collection Time    08/22/14  4:47 PM      Result Value Ref Range   Color, Urine YELLOW  YELLOW   APPearance CLEAR  CLEAR   Specific Gravity, Urine >1.030 (*) 1.005 - 1.030   pH 5.5  5.0 - 8.0   Glucose, UA NEGATIVE  NEGATIVE mg/dL   Hgb urine dipstick NEGATIVE  NEGATIVE   Bilirubin Urine NEGATIVE  NEGATIVE   Ketones, ur NEGATIVE  NEGATIVE mg/dL   Protein, ur NEGATIVE  NEGATIVE mg/dL   Urobilinogen, UA 0.2  0.0 - 1.0 mg/dL   Nitrite NEGATIVE  NEGATIVE   Leukocytes, UA NEGATIVE  NEGATIVE   Comment: MICROSCOPIC NOT DONE ON URINES WITH NEGATIVE PROTEIN, BLOOD, LEUKOCYTES, NITRITE, OR GLUCOSE <1000 mg/dL.  PROTEIN / CREATININE RATIO, URINE     Status: None   Collection Time    08/22/14  4:47 PM      Result Value Ref Range   Creatinine, Urine 160.02     Total Protein, Urine 13.5     Comment: NO NORMAL RANGE ESTABLISHED FOR THIS TEST   PROTEIN CREATININE RATIO 0.08  0.00 - 0.15  CBC     Status: Abnormal   Collection Time    08/22/14  5:35 PM      Result Value Ref Range   WBC 7.0  4.0 - 10.5 K/uL   RBC 4.06  3.87 - 5.11 MIL/uL   Hemoglobin 13.1  12.0 - 15.0 g/dL   HCT 37.0  36.0 - 46.0 %   MCV 91.1  78.0 - 100.0 fL   MCH 32.3  26.0 - 34.0 pg   MCHC 35.4  30.0 - 36.0 g/dL   RDW 13.7  11.5 - 15.5 %   Platelets 121 (*) 150 - 400 K/uL  COMPREHENSIVE METABOLIC PANEL     Status: Abnormal   Collection Time    08/22/14  5:35 PM      Result Value Ref Range   Sodium 138  137 - 147 mEq/L   Potassium 3.8  3.7 - 5.3 mEq/L   Chloride 104  96 - 112 mEq/L   CO2 18 (*) 19 - 32 mEq/L   Glucose, Bld 100 (*) 70 - 99 mg/dL   BUN 10  6 - 23 mg/dL   Creatinine, Ser 0.45 (*) 0.50 - 1.10 mg/dL   Calcium 8.7  8.4 - 10.5 mg/dL   Total Protein 6.5  6.0 - 8.3 g/dL   Albumin 2.7 (*) 3.5 - 5.2 g/dL   AST 23  0 - 37 U/L   ALT 18  0 - 35 U/L   Alkaline Phosphatase 108  39 - 117 U/L   Total Bilirubin 0.3  0.3 - 1.2 mg/dL   GFR calc non  Af Amer >90  >90 mL/min   GFR calc Af Amer >90  >90 mL/min   Comment: (NOTE)     The eGFR has been calculated using the CKD EPI equation.     This calculation has not been validated in all clinical situations.     eGFR's persistently <90 mL/min signify possible Chronic Kidney     Disease.   Anion gap 16 (*) 5 - 15  URIC ACID     Status: None   Collection Time    08/22/14  5:35 PM      Result Value Ref Range   Uric Acid, Serum 4.8  2.4 - 7.0 mg/dL  LACTATE DEHYDROGENASE     Status: None   Collection Time    08/22/14  5:35 PM      Result Value Ref Range   LDH 155  94 - 250 U/L    Review of Systems  Constitutional: Negative for fever and chills.  Eyes: Negative for blurred vision.  Respiratory: Negative for shortness of breath (Occasional, not now).   Cardiovascular: Positive for leg swelling (Bilateral leg swelling, L>R with pain ).  Gastrointestinal: Negative for nausea, vomiting and abdominal pain.  Genitourinary: Negative for dysuria, urgency, frequency and hematuria.  Neurological: Negative for dizziness and headaches.   Physical Exam   Blood pressure 136/85, pulse 89, temperature 98.5 F (36.9 C), temperature source Oral, resp. rate 18, last menstrual period 12/06/2013.  Physical Exam  Constitutional: She is oriented to person, place, and time. She appears well-developed and well-nourished. No distress.  HENT:  Head: Normocephalic.  Eyes: Pupils are equal, round, and reactive to light.  Neck: Neck supple.  Cardiovascular: Normal rate and normal heart sounds.   Respiratory: Effort normal and breath sounds normal. No respiratory distress.  GI: Soft. She exhibits no distension. There is no tenderness.  Musculoskeletal: Normal range of motion.  Neurological: She  is alert and oriented to person, place, and time.  Skin: Skin is warm and intact. Purpura and rash noted. She is not diaphoretic. There is erythema.     Tender, purpura, and erythematous area on left lower  extremity.  1+ pitting edema in bilateral lower extremities   Psychiatric: Her behavior is normal.    Right calf: 17 inches  Left calf 17.5 inches   Fetal Tracing: Baseline: 135 bpm  Variability: Moderate  Accelerations: 15x15 Decelerations: None Toco: none   MAU Course  Procedures None  MDM PIH labs; cbc, cmp, uric acid, LDH, protein/creat ratio urine.  Venous doppler studies of left lower extremity Discussed findings and physical exam with Dr. Jodi Mourning who agrees with the plan of care.  Venous dopplers negative; Dr. Jodi Mourning notified.  Patient declined the need for pain medication    Assessment and Plan   A:  1. Pregnancy induced hypertension, third trimester   2. Edema in pregnancy in third trimester    P:  Discharge patient home in stable condition Venous doppler studies negative for DVT Preeclampsia signs and symptoms discussed with patient Return to MAU as needed, if symptoms worsen Try to keep legs elevated as much as possible.   Darrelyn Hillock Rasch, NP  08/22/2014, 8:32 PM

## 2014-08-26 ENCOUNTER — Encounter: Payer: Self-pay | Admitting: Obstetrics & Gynecology

## 2014-08-26 ENCOUNTER — Ambulatory Visit (INDEPENDENT_AMBULATORY_CARE_PROVIDER_SITE_OTHER): Payer: PRIVATE HEALTH INSURANCE | Admitting: Obstetrics & Gynecology

## 2014-08-26 ENCOUNTER — Encounter (HOSPITAL_COMMUNITY): Payer: Self-pay | Admitting: *Deleted

## 2014-08-26 ENCOUNTER — Telehealth (HOSPITAL_COMMUNITY): Payer: Self-pay | Admitting: *Deleted

## 2014-08-26 ENCOUNTER — Other Ambulatory Visit: Payer: Self-pay | Admitting: *Deleted

## 2014-08-26 VITALS — BP 146/94 | Temp 97.8°F | Wt 278.0 lb

## 2014-08-26 DIAGNOSIS — O133 Gestational [pregnancy-induced] hypertension without significant proteinuria, third trimester: Secondary | ICD-10-CM | POA: Insufficient documentation

## 2014-08-26 DIAGNOSIS — Z3480 Encounter for supervision of other normal pregnancy, unspecified trimester: Secondary | ICD-10-CM

## 2014-08-26 DIAGNOSIS — O139 Gestational [pregnancy-induced] hypertension without significant proteinuria, unspecified trimester: Secondary | ICD-10-CM

## 2014-08-26 DIAGNOSIS — Z3483 Encounter for supervision of other normal pregnancy, third trimester: Secondary | ICD-10-CM

## 2014-08-26 LAB — POCT URINALYSIS DIPSTICK
Bilirubin, UA: NEGATIVE
Blood, UA: NEGATIVE
Glucose, UA: NEGATIVE
Ketones, UA: NEGATIVE
NITRITE UA: POSITIVE
PH UA: 5.5
PROTEIN UA: NEGATIVE
Spec Grav, UA: 1.025
Urobilinogen, UA: NEGATIVE

## 2014-08-26 NOTE — Progress Notes (Signed)
Subjective:    Annette Miranda is a 33 y.o. female being seen today for her obstetrical visit. She is at [redacted]w[redacted]d gestation. Patient reports no complaints. Fetal movement: normal.  Problem List Items Addressed This Visit     Unprioritized   Supervision of other normal pregnancy - Primary   Relevant Orders      POCT urinalysis dipstick     Patient Active Problem List   Diagnosis Date Noted  . GERD without esophagitis 06/04/2014  . Second trimester bleeding 05/09/2014  . Tobacco smoking complicating pregnancy 17/61/6073  . 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 Supervision of other normal pregnancy 01/16/2014    Objective:    BP 146/94  Temp(Src) 97.8 F (36.6 C)  Wt 126.1 kg (278 lb)  LMP 12/06/2013 FHT: 140 BPM  Uterine Size: size equals dates  Presentations: cephalic     Assessment:    Pregnancy @ [redacted]w[redacted]d weeks  PIH Plan:   Plans for delivery: Vaginal anticipated; labs reviewed; problem list updated IOL tomorrow

## 2014-08-26 NOTE — Addendum Note (Signed)
Addended by: Ladona Ridgel on: 08/26/2014 05:17 PM   Modules accepted: Orders

## 2014-08-26 NOTE — Telephone Encounter (Signed)
Preadmission screen  

## 2014-08-26 NOTE — Patient Instructions (Signed)
Labor Induction  Labor induction is when steps are taken to cause a pregnant woman to begin the labor process. Most women go into labor on their own between 37 weeks and 42 weeks of the pregnancy. When this does not happen or when there is a medical need, methods may be used to induce labor. Labor induction causes a pregnant woman's uterus to contract. It also causes the cervix to soften (ripen), open (dilate), and thin out (efface). Usually, labor is not induced before 39 weeks of the pregnancy unless there is a problem with the baby or mother.  Before inducing labor, your health care provider will consider a number of factors, including the following:  The medical condition of you and the baby.   How many weeks along you are.   The status of the baby's lung maturity.   The condition of the cervix.   The position of the baby.  WHAT ARE THE REASONS FOR LABOR INDUCTION? Labor may be induced for the following reasons:  The health of the baby or mother is at risk.   The pregnancy is overdue by 1 week or more.   The water breaks but labor does not start on its own.   The mother has a health condition or serious illness, such as high blood pressure, infection, placental abruption, or diabetes.  The amniotic fluid amounts are low around the baby.   The baby is distressed.  Convenience or wanting the baby to be born on a certain date is not a reason for inducing labor. WHAT METHODS ARE USED FOR LABOR INDUCTION? Several methods of labor induction may be used, such as:   Prostaglandin medicine. This medicine causes the cervix to dilate and ripen. The medicine will also start contractions. It can be taken by mouth or by inserting a suppository into the vagina.   Inserting a thin tube (catheter) with a balloon on the end into the vagina to dilate the cervix. Once inserted, the balloon is expanded with water, which causes the cervix to open.   Stripping the membranes. Your health  care provider separates amniotic sac tissue from the cervix, causing the cervix to be stretched and causing the release of a hormone called progesterone. This may cause the uterus to contract. It is often done during an office visit. You will be sent home to wait for the contractions to begin. You will then come in for an induction.   Breaking the water. Your health care provider makes a hole in the amniotic sac using a small instrument. Once the amniotic sac breaks, contractions should begin. This may still take hours to see an effect.   Medicine to trigger or strengthen contractions. This medicine is given through an IV access tube inserted into a vein in your arm.  All of the methods of induction, besides stripping the membranes, will be done in the hospital. Induction is done in the hospital so that you and the baby can be carefully monitored.  HOW LONG DOES IT TAKE FOR LABOR TO BE INDUCED? Some inductions can take up to 2-3 days. Depending on the cervix, it usually takes less time. It takes longer when you are induced early in the pregnancy or if this is your first pregnancy. If a mother is still pregnant and the induction has been going on for 2-3 days, either the mother will be sent home or a cesarean delivery will be needed. WHAT ARE THE RISKS ASSOCIATED WITH LABOR INDUCTION? Some of the risks of induction   include:   Changes in fetal heart rate, such as too high, too low, or erratic.   Fetal distress.   Chance of infection for the mother and baby.   Increased chance of having a cesarean delivery.   Breaking off (abruption) of the placenta from the uterus (rare).   Uterine rupture (very rare).  When induction is needed for medical reasons, the benefits of induction may outweigh the risks. WHAT ARE SOME REASONS FOR NOT INDUCING LABOR? Labor induction should not be done if:   It is shown that your baby does not tolerate labor.   You have had previous surgeries on your  uterus, such as a myomectomy or the removal of fibroids.   Your placenta lies very low in the uterus and blocks the opening of the cervix (placenta previa).   Your baby is not in a head-down position.   The umbilical cord drops down into the birth canal in front of the baby. This could cut off the baby's blood and oxygen supply.   You have had a previous cesarean delivery.   There are unusual circumstances, such as the baby being extremely premature.  Document Released: 04/25/2007 Document Revised: 08/06/2013 Document Reviewed: 07/03/2013 [employer] Patient Information 2015 [employer]. This information is not intended to replace advice given to you by your health care provider. Make sure you discuss any questions you have with your health care provider.  

## 2014-08-26 NOTE — Progress Notes (Signed)
Patient was seen at the hospital- swelling. Patient state she had elevated BP and extreme swelling. Patient is experiencing SOB. O2 sat 98

## 2014-08-27 ENCOUNTER — Inpatient Hospital Stay (HOSPITAL_COMMUNITY)
Admission: RE | Admit: 2014-08-27 | Discharge: 2014-08-30 | DRG: 775 | Disposition: A | Discharge status: Home / Self Care | Payer: No Typology Code available for payment source | Source: Ambulatory Visit | Attending: Obstetrics | Admitting: Obstetrics

## 2014-08-27 ENCOUNTER — Encounter (HOSPITAL_COMMUNITY): Payer: Self-pay

## 2014-08-27 VITALS — BP 118/76 | HR 84 | Temp 98.0°F | Resp 18 | Ht 69.0 in | Wt 278.0 lb

## 2014-08-27 DIAGNOSIS — O139 Gestational [pregnancy-induced] hypertension without significant proteinuria, unspecified trimester: Principal | ICD-10-CM | POA: Diagnosis present

## 2014-08-27 DIAGNOSIS — O2690 Pregnancy related conditions, unspecified, unspecified trimester: Secondary | ICD-10-CM | POA: Diagnosis present

## 2014-08-27 DIAGNOSIS — Z87891 Personal history of nicotine dependence: Secondary | ICD-10-CM

## 2014-08-27 DIAGNOSIS — Z6841 Body Mass Index (BMI) 40.0 and over, adult: Secondary | ICD-10-CM

## 2014-08-27 DIAGNOSIS — E669 Obesity, unspecified: Secondary | ICD-10-CM | POA: Diagnosis present

## 2014-08-27 DIAGNOSIS — O133 Gestational [pregnancy-induced] hypertension without significant proteinuria, third trimester: Secondary | ICD-10-CM

## 2014-08-27 DIAGNOSIS — Z8249 Family history of ischemic heart disease and other diseases of the circulatory system: Secondary | ICD-10-CM

## 2014-08-27 DIAGNOSIS — O99214 Obesity complicating childbirth: Secondary | ICD-10-CM

## 2014-08-27 DIAGNOSIS — O4692 Antepartum hemorrhage, unspecified, second trimester: Secondary | ICD-10-CM

## 2014-08-27 DIAGNOSIS — Z3483 Encounter for supervision of other normal pregnancy, third trimester: Secondary | ICD-10-CM

## 2014-08-27 HISTORY — DX: Migraine, unspecified, not intractable, without status migrainosus: G43.909

## 2014-08-27 HISTORY — DX: Benign neoplasm of connective and other soft tissue, unspecified: D21.9

## 2014-08-27 LAB — TYPE AND SCREEN
ABO/RH(D): A POS
Antibody Screen: NEGATIVE

## 2014-08-27 LAB — ABO/RH: ABO/RH(D): A POS

## 2014-08-27 LAB — CBC
HEMATOCRIT: 38.6 % (ref 36.0–46.0)
Hemoglobin: 13.2 g/dL (ref 12.0–15.0)
MCH: 30.8 pg (ref 26.0–34.0)
MCHC: 34.2 g/dL (ref 30.0–36.0)
MCV: 90.2 fL (ref 78.0–100.0)
Platelets: 137 10*3/uL — ABNORMAL LOW (ref 150–400)
RBC: 4.28 MIL/uL (ref 3.87–5.11)
RDW: 14 % (ref 11.5–15.5)
WBC: 7 10*3/uL (ref 4.0–10.5)

## 2014-08-27 LAB — RPR

## 2014-08-27 MED ORDER — LACTATED RINGERS IV SOLN
INTRAVENOUS | Status: DC
  Administered 2014-08-27 – 2014-08-28 (×5): via INTRAVENOUS

## 2014-08-27 MED ORDER — MISOPROSTOL 25 MCG QUARTER TABLET
25.0000 ug | ORAL_TABLET | ORAL | Status: DC | PRN
  Filled 2014-08-27: qty 1

## 2014-08-27 MED ORDER — ZOLPIDEM TARTRATE 5 MG PO TABS: 5.0000 mg | ORAL_TABLET | Freq: Every evening | ORAL | Status: DC | PRN

## 2014-08-27 MED ORDER — OXYCODONE-ACETAMINOPHEN 5-325 MG PO TABS: 1.0000 | ORAL_TABLET | ORAL | Status: DC | PRN

## 2014-08-27 MED ORDER — OXYTOCIN BOLUS FROM INFUSION: 500.0000 mL | INTRAVENOUS | Status: DC

## 2014-08-27 MED ORDER — MISOPROSTOL 25 MCG QUARTER TABLET
25.0000 ug | ORAL_TABLET | ORAL | Status: DC | PRN
  Administered 2014-08-27 (×2): 25 ug via VAGINAL
  Filled 2014-08-27 (×2): qty 0.25

## 2014-08-27 MED ORDER — ACETAMINOPHEN 325 MG PO TABS: 650.0000 mg | ORAL_TABLET | ORAL | Status: DC | PRN

## 2014-08-27 MED ORDER — LIDOCAINE HCL (PF) 1 % IJ SOLN
30.0000 mL | INTRAMUSCULAR | Status: DC | PRN
  Filled 2014-08-27: qty 30

## 2014-08-27 MED ORDER — TERBUTALINE SULFATE 1 MG/ML IJ SOLN: 0.2500 mg | Freq: Once | INTRAMUSCULAR | Status: AC | PRN

## 2014-08-27 MED ORDER — FLEET ENEMA 7-19 GM/118ML RE ENEM: 1.0000 | ENEMA | RECTAL | Status: DC | PRN

## 2014-08-27 MED ORDER — OXYCODONE-ACETAMINOPHEN 5-325 MG PO TABS: 2.0000 | ORAL_TABLET | ORAL | Status: DC | PRN

## 2014-08-27 MED ORDER — OXYTOCIN 40 UNITS IN LACTATED RINGERS INFUSION - SIMPLE MED: 62.5000 mL/h | INTRAVENOUS | Status: DC

## 2014-08-27 MED ORDER — OXYTOCIN 40 UNITS IN LACTATED RINGERS INFUSION - SIMPLE MED
1.0000 m[IU]/min | INTRAVENOUS | Status: DC
  Administered 2014-08-27: 1 m[IU]/min via INTRAVENOUS
  Filled 2014-08-27: qty 1000

## 2014-08-27 MED ORDER — LACTATED RINGERS IV SOLN
500.0000 mL | INTRAVENOUS | Status: DC | PRN
  Administered 2014-08-27: 1000 mL via INTRAVENOUS

## 2014-08-27 MED ORDER — ONDANSETRON HCL 4 MG/2ML IJ SOLN
4.0000 mg | Freq: Four times a day (QID) | INTRAMUSCULAR | Status: DC | PRN
Start: 2014-08-27 — End: 2014-08-28
  Administered 2014-08-27 – 2014-08-28 (×3): 4 mg via INTRAVENOUS
  Filled 2014-08-27 (×3): qty 2

## 2014-08-27 MED ORDER — CITRIC ACID-SODIUM CITRATE 334-500 MG/5ML PO SOLN
30.0000 mL | ORAL | Status: DC | PRN
  Administered 2014-08-27: 30 mL via ORAL
  Filled 2014-08-27: qty 15

## 2014-08-27 NOTE — H&P (Signed)
Annette Miranda is a 33 y.o. female presenting for IOL for [place]. Maternal Medical History:  Reason for admission: [place]   Fetal activity: Perceived fetal activity is normal.   Last perceived fetal movement was within the past hour.    Prenatal complications: PIH.   Prenatal Complications - Diabetes: none.    OB History   Grav Para Term Preterm Abortions TAB SAB Ect Mult Living   3 2 2       2      Past Medical History  Diagnosis Date  . Pregnancy induced hypertension   . Fibroid    Past Surgical History  Procedure Laterality Date  . Cholecystectomy     Family History: family history includes Cancer in her maternal grandmother and paternal grandfather; Hypertension in her father, maternal grandfather, maternal grandmother, mother, paternal grandfather, and paternal grandmother. Social History:  reports that she quit smoking about 6 months ago. Her smoking use included Cigarettes. She smoked 0.00 packs per day. She does not have any smokeless tobacco history on file. She reports that she does not drink alcohol or use illicit drugs.   Prenatal Transfer Tool  Maternal Diabetes: No Genetic Screening: Normal Maternal Ultrasounds/Referrals: Normal Fetal Ultrasounds or other Referrals:  None Maternal Substance Abuse:  No Significant Maternal Medications:  None Significant Maternal Lab Results:  None Other Comments:  None  Review of Systems  All other systems reviewed and are negative.   Dilation: 1 Effacement (%): Thick Station: -2 Exam by:: hayes Blood pressure 130/83, pulse 84, temperature 98.7 F (37.1 C), temperature source Oral, resp. rate 20, height 5\' 9"  (1.753 m), weight 278 lb (126.1 kg), last menstrual period 12/06/2013. Maternal Exam:  Abdomen: Patient reports no abdominal tenderness. Fetal presentation: vertex  Introitus: Normal vulva. Normal vagina.    Physical Exam  Nursing note and vitals reviewed. Constitutional: She is oriented to person, place, and time.  She appears well-developed and well-nourished.  HENT:  Head: Normocephalic and atraumatic.  Eyes: Conjunctivae are normal. Pupils are equal, round, and reactive to light.  Neck: Normal range of motion. Neck supple.  Cardiovascular: Normal rate and regular rhythm.   Respiratory: Effort normal.  GI: Soft.  Genitourinary: Vagina normal and uterus normal.  Musculoskeletal: Normal range of motion.  Neurological: She is alert and oriented to person, place, and time.  Skin: Skin is warm and dry.  Psychiatric: She has a normal mood and affect. Her behavior is normal. Judgment and thought content normal.    Prenatal labs: ABO, Rh: --/--/A POS, A POS (09/10 0745) Antibody: NEG (09/10 0745) Rubella: 1.24 (03/12 1306) RPR: NON REAC (09/10 0745)  HBsAg: NEGATIVE (03/12 1306)  HIV: NONREACTIVE (06/03 1320)  GBS: NOT DETECTED (08/20 1455)   Assessment/Plan: 38.6 weeks.  PIH.  2 stage IOL.   Annette Miranda A 08/27/2014, 4:40 PM

## 2014-08-28 ENCOUNTER — Inpatient Hospital Stay (HOSPITAL_COMMUNITY): Payer: No Typology Code available for payment source | Admitting: Anesthesiology

## 2014-08-28 ENCOUNTER — Encounter (HOSPITAL_COMMUNITY): Payer: No Typology Code available for payment source | Admitting: Anesthesiology

## 2014-08-28 ENCOUNTER — Encounter (HOSPITAL_COMMUNITY): Payer: Self-pay

## 2014-08-28 LAB — CBC
HEMATOCRIT: 36.9 % (ref 36.0–46.0)
HEMATOCRIT: 36 % (ref 36.0–46.0)
HEMOGLOBIN: 12.4 g/dL (ref 12.0–15.0)
Hemoglobin: 12.9 g/dL (ref 12.0–15.0)
MCH: 31.8 pg (ref 26.0–34.0)
MCH: 31.4 pg (ref 26.0–34.0)
MCHC: 35 g/dL (ref 30.0–36.0)
MCHC: 34.4 g/dL (ref 30.0–36.0)
MCV: 90.9 fL (ref 78.0–100.0)
MCV: 91.1 fL (ref 78.0–100.0)
PLATELETS: 117 10*3/uL — AB (ref 150–400)
Platelets: 116 10*3/uL — ABNORMAL LOW (ref 150–400)
RBC: 4.06 MIL/uL (ref 3.87–5.11)
RBC: 3.95 MIL/uL (ref 3.87–5.11)
RDW: 13.6 % (ref 11.5–15.5)
RDW: 13.5 % (ref 11.5–15.5)
WBC: 7.2 10*3/uL (ref 4.0–10.5)
WBC: 7.8 10*3/uL (ref 4.0–10.5)

## 2014-08-28 LAB — URINE CULTURE: Colony Count: 15000

## 2014-08-28 MED ORDER — SIMETHICONE 80 MG PO CHEW: 80.0000 mg | CHEWABLE_TABLET | ORAL | Status: DC | PRN

## 2014-08-28 MED ORDER — METHYLERGONOVINE MALEATE 0.2 MG PO TABS
0.2000 mg | ORAL_TABLET | ORAL | Status: AC
  Administered 2014-08-28 – 2014-08-30 (×6): 0.2 mg via ORAL
  Filled 2014-08-28 (×6): qty 1

## 2014-08-28 MED ORDER — ONDANSETRON HCL 4 MG PO TABS
4.0000 mg | ORAL_TABLET | ORAL | Status: DC | PRN
  Administered 2014-08-28: 4 mg via ORAL
  Filled 2014-08-28: qty 1

## 2014-08-28 MED ORDER — DIPHENHYDRAMINE HCL 25 MG PO CAPS: 25.0000 mg | ORAL_CAPSULE | Freq: Four times a day (QID) | ORAL | Status: DC | PRN

## 2014-08-28 MED ORDER — DIBUCAINE 1 % RE OINT: 1.0000 "application " | TOPICAL_OINTMENT | RECTAL | Status: DC | PRN

## 2014-08-28 MED ORDER — FENTANYL 2.5 MCG/ML BUPIVACAINE 1/10 % EPIDURAL INFUSION (WH - ANES)
INTRAMUSCULAR | Status: AC
  Administered 2014-08-28: 14 mL/h via EPIDURAL
  Filled 2014-08-28: qty 125

## 2014-08-28 MED ORDER — LACTATED RINGERS IV SOLN: 500.0000 mL | Freq: Once | INTRAVENOUS | Status: DC

## 2014-08-28 MED ORDER — OXYCODONE-ACETAMINOPHEN 5-325 MG PO TABS
1.0000 | ORAL_TABLET | ORAL | Status: DC | PRN
  Administered 2014-08-29 (×2): 1 via ORAL
  Filled 2014-08-28 (×2): qty 1

## 2014-08-28 MED ORDER — PHENYLEPHRINE 40 MCG/ML (10ML) SYRINGE FOR IV PUSH (FOR BLOOD PRESSURE SUPPORT)
PREFILLED_SYRINGE | INTRAVENOUS | Status: AC
  Filled 2014-08-28: qty 10

## 2014-08-28 MED ORDER — IBUPROFEN 600 MG PO TABS
600.0000 mg | ORAL_TABLET | Freq: Four times a day (QID) | ORAL | Status: DC
  Administered 2014-08-28 – 2014-08-29 (×2): 600 mg via ORAL
  Filled 2014-08-28 (×2): qty 1

## 2014-08-28 MED ORDER — ONDANSETRON HCL 4 MG/2ML IJ SOLN: 4.0000 mg | INTRAMUSCULAR | Status: DC | PRN

## 2014-08-28 MED ORDER — BENZOCAINE-MENTHOL 20-0.5 % EX AERO
1.0000 "application " | INHALATION_SPRAY | CUTANEOUS | Status: DC | PRN
  Filled 2014-08-28: qty 56

## 2014-08-28 MED ORDER — LIDOCAINE HCL (PF) 1 % IJ SOLN
INTRAMUSCULAR | Status: DC | PRN
  Administered 2014-08-28 (×2): 5 mL

## 2014-08-28 MED ORDER — SUMATRIPTAN SUCCINATE 50 MG PO TABS
50.0000 mg | ORAL_TABLET | ORAL | Status: DC | PRN
  Administered 2014-08-28: 50 mg via ORAL
  Filled 2014-08-28: qty 1

## 2014-08-28 MED ORDER — PRENATAL MULTIVITAMIN CH
1.0000 | ORAL_TABLET | Freq: Every day | ORAL | Status: DC
  Administered 2014-08-29: 1 via ORAL
  Filled 2014-08-28: qty 1

## 2014-08-28 MED ORDER — TETANUS-DIPHTH-ACELL PERTUSSIS 5-2.5-18.5 LF-MCG/0.5 IM SUSP: 0.5000 mL | Freq: Once | INTRAMUSCULAR | Status: DC

## 2014-08-28 MED ORDER — ZOLPIDEM TARTRATE 5 MG PO TABS: 5.0000 mg | ORAL_TABLET | Freq: Every evening | ORAL | Status: DC | PRN

## 2014-08-28 MED ORDER — LANOLIN HYDROUS EX OINT: TOPICAL_OINTMENT | CUTANEOUS | Status: DC | PRN

## 2014-08-28 MED ORDER — METHYLERGONOVINE MALEATE 0.2 MG/ML IJ SOLN
INTRAMUSCULAR | Status: AC
  Administered 2014-08-28: 0.2 mg via INTRAMUSCULAR
  Filled 2014-08-28: qty 1

## 2014-08-28 MED ORDER — METHYLERGONOVINE MALEATE 0.2 MG/ML IJ SOLN: 0.2000 mg | INTRAMUSCULAR | Status: AC

## 2014-08-28 MED ORDER — PHENYLEPHRINE 40 MCG/ML (10ML) SYRINGE FOR IV PUSH (FOR BLOOD PRESSURE SUPPORT)
80.0000 ug | PREFILLED_SYRINGE | INTRAVENOUS | Status: DC | PRN
  Filled 2014-08-28: qty 2

## 2014-08-28 MED ORDER — FENTANYL 2.5 MCG/ML BUPIVACAINE 1/10 % EPIDURAL INFUSION (WH - ANES)
14.0000 mL/h | INTRAMUSCULAR | Status: DC | PRN
  Administered 2014-08-28: 14 mL/h via EPIDURAL

## 2014-08-28 MED ORDER — OXYCODONE-ACETAMINOPHEN 5-325 MG PO TABS
2.0000 | ORAL_TABLET | ORAL | Status: DC | PRN
  Administered 2014-08-29 – 2014-08-30 (×4): 2 via ORAL
  Filled 2014-08-28 (×4): qty 2

## 2014-08-28 MED ORDER — SENNOSIDES-DOCUSATE SODIUM 8.6-50 MG PO TABS
2.0000 | ORAL_TABLET | ORAL | Status: DC
  Administered 2014-08-28 – 2014-08-30 (×2): 2 via ORAL
  Filled 2014-08-28 (×2): qty 2

## 2014-08-28 MED ORDER — EPHEDRINE 5 MG/ML INJ
10.0000 mg | INTRAVENOUS | Status: DC | PRN
  Filled 2014-08-28: qty 2

## 2014-08-28 MED ORDER — WITCH HAZEL-GLYCERIN EX PADS: 1.0000 "application " | MEDICATED_PAD | CUTANEOUS | Status: DC | PRN

## 2014-08-28 MED ORDER — FERROUS SULFATE 325 (65 FE) MG PO TABS
325.0000 mg | ORAL_TABLET | Freq: Two times a day (BID) | ORAL | Status: DC
  Administered 2014-08-29 – 2014-08-30 (×3): 325 mg via ORAL
  Filled 2014-08-28 (×3): qty 1

## 2014-08-28 MED ORDER — DIPHENHYDRAMINE HCL 50 MG/ML IJ SOLN: 12.5000 mg | INTRAMUSCULAR | Status: DC | PRN

## 2014-08-28 NOTE — Anesthesia Preprocedure Evaluation (Signed)
Anesthesia Evaluation  Patient identified by MRN, date of birth, ID band Patient awake    Reviewed: Allergy & Precautions, H&P , Patient's Chart, lab work & pertinent test results  Airway Mallampati: III TM Distance: >3 FB Neck ROM: full    Dental   Pulmonary former smoker,  breath sounds clear to auscultation        Cardiovascular hypertension, Rhythm:regular Rate:Normal     Neuro/Psych    GI/Hepatic GERD-  ,  Endo/Other  Morbid obesity  Renal/GU      Musculoskeletal   Abdominal   Peds  Hematology   Anesthesia Other Findings   Reproductive/Obstetrics (+) Pregnancy                           Anesthesia Physical Anesthesia Plan  ASA: III  Anesthesia Plan: Epidural   Post-op Pain Management:    Induction:   Airway Management Planned:   Additional Equipment:   Intra-op Plan:   Post-operative Plan:   Informed Consent: I have reviewed the patients History and Physical, chart, labs and discussed the procedure including the risks, benefits and alternatives for the proposed anesthesia with the patient or authorized representative who has indicated his/her understanding and acceptance.     Plan Discussed with:   Anesthesia Plan Comments:         Anesthesia Quick Evaluation

## 2014-08-28 NOTE — Anesthesia Procedure Notes (Signed)
Epidural Patient location during procedure: OB Start time: 08/28/2014 1:34 PM  Preanesthetic Checklist Completed: patient identified, site marked, surgical consent, pre-op evaluation, timeout performed, IV checked, risks and benefits discussed and monitors and equipment checked  Epidural Patient position: sitting Prep: site prepped and draped and DuraPrep Patient monitoring: continuous pulse ox and blood pressure Approach: midline Injection technique: LOR air  Needle:  Needle type: Tuohy  Needle gauge: 17 G Needle length: 9 cm and 9 Needle insertion depth: 5 cm cm Catheter type: closed end flexible Catheter size: 19 Gauge Catheter at skin depth: 10 cm Test dose: negative  Assessment Events: blood not aspirated, injection not painful, no injection resistance, negative IV test and no paresthesia  Additional Notes Patient identified.  Risk benefits discussed including failed block, incomplete pain control, headache, nerve damage, paralysis, blood pressure changes, nausea, vomiting, reactions to medication both toxic or allergic, and postpartum back pain.  Patient expressed understanding and wished to proceed.  All questions were answered.  Sterile technique used throughout procedure and epidural site dressed with sterile barrier dressing. No paresthesia or other complications noted.The patient did not experience any signs of intravascular injection such as tinnitus or metallic taste in mouth nor signs of intrathecal spread such as rapid motor block. Please see nursing notes for vital signs.

## 2014-08-28 NOTE — Progress Notes (Signed)
Annette Miranda is Miranda 33 y.o. G3P2002 at [redacted]w[redacted]d by LMP admitted for induction of labor due to gestational hypertension.  Subjective: Comfortable  Objective: BP 103/74  Pulse 97  Temp(Src) 98.1 F (36.7 C) (Oral)  Resp 20  Ht 5\' 9"  (1.753 m)  Wt 126.1 kg (278 lb)  BMI 41.03 kg/m2  LMP 12/06/2013      FHT:  FHR: 140 bpm, variability: moderate,  accelerations:  Present,  decelerations:  Absent UC:   irregular, every 5 minutes SVE:   Dilation: 2.5 Effacement (%): Thick Station: -3 Exam by:: Dr. Delsa Sale AROM: blood-tinged fluid Labs: Lab Results  Component Value Date   WBC 7.0 08/27/2014   HGB 13.2 08/27/2014   HCT 38.6 08/27/2014   MCV 90.2 08/27/2014   PLT 137* 08/27/2014    Assessment / Plan: Induction of labor secondary to gestational hypertension, prodromal labor  Labor: see above; Pitocin per protocol Preeclampsia:  intake and ouput balanced and labs stable Fetal Wellbeing:  Category I Pain Control:  Labor support without medications I/D:  n/Miranda Anticipated MOD:  NSVD  Annette Miranda 08/28/2014, 9:15 AM

## 2014-08-29 LAB — CBC
HEMATOCRIT: 33.7 % — AB (ref 36.0–46.0)
Hemoglobin: 11.6 g/dL — ABNORMAL LOW (ref 12.0–15.0)
MCH: 31.3 pg (ref 26.0–34.0)
MCHC: 34.4 g/dL (ref 30.0–36.0)
MCV: 90.8 fL (ref 78.0–100.0)
Platelets: 96 10*3/uL — ABNORMAL LOW (ref 150–400)
RBC: 3.71 MIL/uL — ABNORMAL LOW (ref 3.87–5.11)
RDW: 13.5 % (ref 11.5–15.5)
WBC: 6.6 10*3/uL (ref 4.0–10.5)

## 2014-08-29 NOTE — Lactation Note (Signed)
This note was copied from the chart of Annette [employer]. Lactation Consultation Note  Patient Name: Annette Miranda Today's Date: 08/29/2014     Maternal Data Formula Feeding for Exclusion: Yes Reason for exclusion: Mother's choice to formula feed on admision  Feeding    LATCH Score/Interventions                      Lactation Tools Discussed/Used     Consult Status Consult Status: Complete    Ave Filter 08/29/2014, 12:10 PM

## 2014-08-29 NOTE — Anesthesia Postprocedure Evaluation (Signed)
Anesthesia Post Note  Patient: Visual merchandiser  Procedure(s) Performed: * No procedures listed *  Anesthesia type: Epidural  Patient location: Mother/Baby  Post pain: Pain level controlled  Post assessment: Post-op Vital signs reviewed  Last Vitals:  Filed Vitals:   08/29/14 0230  BP: 125/80  Pulse: 77  Temp: 36.7 C  Resp: 20    Post vital signs: Reviewed  Level of consciousness:alert  Complications: No apparent anesthesia complications

## 2014-08-29 NOTE — Progress Notes (Signed)
Patient ID: Annette Miranda, female   DOB: [birth year], 33 y.o.   MRN: [id number] Postpartum day one Vital signs normal Fundus firm Legs negative Doing well

## 2014-08-30 NOTE — Progress Notes (Signed)
Patient ID: Annette Miranda, female   DOB: [birth year], 33 y.o.   MRN: [id number] Postpartum day 2 Vital signs normal Fundus firm Lochia moderate Legs negative Home today

## 2014-08-30 NOTE — Discharge Summary (Signed)
Obstetric Discharge Summary Reason for Admission: induction of labor Prenatal Procedures: none Intrapartum Procedures: spontaneous vaginal delivery Postpartum Procedures: none Complications-Operative and Postpartum: none Hemoglobin  Date Value Ref Range Status  08/29/2014 11.6* 12.0 - 15.0 g/dL Final     HCT  Date Value Ref Range Status  08/29/2014 33.7* 36.0 - 46.0 % Final    Physical Exam:  General: alert Lochia: appropriate Uterine Fundus: firm Incision: healing well DVT Evaluation: No evidence of DVT seen on physical exam.  Discharge Diagnoses: Term Pregnancy-delivered  Discharge Information: Date: 08/30/2014 Activity: pelvic rest Diet: routine Medications: Percocet Condition: stable Instructions: refer to practice specific booklet Discharge to: home Follow-up Information   Follow up with HARPER,CHARLES A, MD In 6 weeks.   Specialty:  Obstetrics and Gynecology   Contact information:   [address] [place] [address] [phone]       Newborn Data: Live born female  Birth Weight: 8 lb 4.4 oz (3754 g) APGAR: 8, 9  Home with mother.  Tab Rylee A 08/30/2014, 7:41 AM

## 2014-08-30 NOTE — Discharge Instructions (Signed)
Discharge instructions   You can wash your hair  Shower  Eat what you want  Drink what you want  See me in 6 weeks  Your ankles are going to swell more in the next 2 weeks than when pregnant  No sex for 6 weeks   Annette Miranda A, MD 08/30/2014

## 2014-08-30 NOTE — Plan of Care (Signed)
Problem: Discharge Progression Outcomes Goal: Complications resolved/controlled Outcome: Completed/Met Date Met:  08/30/14 Patient denies dizzyness at this time.

## 2014-10-12 ENCOUNTER — Ambulatory Visit (INDEPENDENT_AMBULATORY_CARE_PROVIDER_SITE_OTHER): Payer: PRIVATE HEALTH INSURANCE | Admitting: Obstetrics & Gynecology

## 2014-10-12 ENCOUNTER — Other Ambulatory Visit: Payer: Self-pay | Admitting: Obstetrics & Gynecology

## 2014-10-12 DIAGNOSIS — G43001 Migraine without aura, not intractable, with status migrainosus: Secondary | ICD-10-CM

## 2014-10-12 MED ORDER — SUMATRIPTAN SUCCINATE 50 MG PO TABS: 50.0000 mg | ORAL_TABLET | ORAL | Status: DC | PRN

## 2014-10-12 MED ORDER — TOPIRAMATE 50 MG PO TABS: ORAL_TABLET | ORAL | Status: DC

## 2014-10-13 ENCOUNTER — Encounter: Payer: Self-pay | Admitting: Obstetrics & Gynecology

## 2014-10-13 LAB — GC/CHLAMYDIA PROBE AMP
CT PROBE, AMP APTIMA: NEGATIVE
GC PROBE AMP APTIMA: NEGATIVE

## 2014-10-13 LAB — WET PREP BY MOLECULAR PROBE
CANDIDA SPECIES: NEGATIVE
GARDNERELLA VAGINALIS: NEGATIVE
TRICHOMONAS VAG: NEGATIVE

## 2014-10-13 NOTE — Progress Notes (Signed)
Patient ID: Annette Miranda, female   DOB: 11-04-1981, 33 y.o.   MRN: [id number] Subjective:     Annette Miranda is a 33 y.o. female who presents for a postpartum visit. She is 6 weeks postpartum following a spontaneous vaginal delivery. I have fully reviewed the prenatal and intrapartum course. The delivery was at term.  Outcome: spontaneous vaginal delivery.  Postpartum course has been uneventful. Baby's course has been normal.  Bleeding no bleeding. Bowel function is normal. Bladder function is normal. Patient is not sexually active. Contraception method is abstinence. Postpartum depression screening: negative.  Tobacco, alcohol and substance abuse history reviewed.  Adult immunizations reviewed including TDAP, rubella and varicella.  The following portions of the patient's history were reviewed and updated as appropriate: allergies, current medications, past family history, past medical history, past social history, past surgical history and problem list.  Review of Systems Pertinent items are noted in HPI.   Objective:    BP 146/84  Pulse 100  Wt 119.75 kg (264 lb)       General:   alert  Skin:   no rash or abnormalities  Lungs:   clear to auscultation bilaterally  Heart:   regular rate and rhythm, S1, S2 normal, no murmur, click, rub or gallop  Abdomen:  normal findings: no organomegaly, soft, non-tender and no hernia  Pelvis:  External genitalia: normal general appearance Urinary system: urethral meatus normal and bladder without fullness, nontender Vaginal: thin, white discharge Cervix: normal appearance Adnexa: normal bimanual exam Uterus: anteverted and non-tender, normal size      Assessment:     Normal postpartum exam. Pap smear done at today's visit.  H/O PIH--borderline B/P elevation Migraine HA Plan:   Meds ordered this encounter  Medications  . fluticasone (FLONASE) 50 MCG/ACT nasal spray    Sig: Place into both nostrils daily.  Marland Kitchen topiramate (TOPAMAX) 50 MG tablet   Sig: Take 25 mg daily for 7 days. Then increase to 50 mg daily.    Dispense:  30 tablet    Refill:  2  . SUMAtriptan (IMITREX) 50 MG tablet    Sig: Take 1 tablet (50 mg total) by mouth every 2 (two) hours as needed for migraine or headache. May repeat in 2 hours if headache persists or recurs.    Dispense:  10 tablet    Refill:  2   Contraception: plans a Mirena IUD  Follow up for IUD insertion  Preconception counseling provided Healthy lifestyle practices reviewed

## 2014-10-13 NOTE — Patient Instructions (Signed)
Levonorgestrel intrauterine device (IUD) What is this medicine? LEVONORGESTREL IUD (LEE voe nor jes trel) is a contraceptive (birth control) device. The device is placed inside the uterus by a healthcare professional. It is used to prevent pregnancy and can also be used to treat heavy bleeding that occurs during your period. Depending on the device, it can be used for 3 to 5 years. This medicine may be used for other purposes; ask your health care provider or pharmacist if you have questions. COMMON BRAND NAME(S): LILETTA, Mirena, Skyla What should I tell my health care provider before I take this medicine? They need to know if you have any of these conditions: -abnormal Pap smear -cancer of the breast, uterus, or cervix -diabetes -endometritis -genital or pelvic infection now or in the past -have more than one sexual partner or your partner has more than one partner -heart disease -history of an ectopic or tubal pregnancy -immune system problems -IUD in place -liver disease or tumor -problems with blood clots or take blood-thinners -use intravenous drugs -uterus of unusual shape -vaginal bleeding that has not been explained -an unusual or allergic reaction to levonorgestrel, other hormones, silicone, or polyethylene, medicines, foods, dyes, or preservatives -pregnant or trying to get pregnant -breast-feeding How should I use this medicine? This device is placed inside the uterus by a health care professional. Talk to your pediatrician regarding the use of this medicine in children. Special care may be needed. Overdosage: If you think you have taken too much of this medicine contact a poison control center or emergency room at once. NOTE: This medicine is only for you. Do not share this medicine with others. What if I miss a dose? This does not apply. What may interact with this medicine? Do not take this medicine with any of the following  medications: -amprenavir -bosentan -fosamprenavir This medicine may also interact with the following medications: -aprepitant -barbiturate medicines for inducing sleep or treating seizures -bexarotene -griseofulvin -medicines to treat seizures like carbamazepine, ethotoin, felbamate, oxcarbazepine, phenytoin, topiramate -modafinil -pioglitazone -rifabutin -rifampin -rifapentine -some medicines to treat HIV infection like atazanavir, indinavir, lopinavir, nelfinavir, tipranavir, ritonavir -St. John's wort -warfarin This list may not describe all possible interactions. Give your health care provider a list of all the medicines, herbs, non-prescription drugs, or dietary supplements you use. Also tell them if you smoke, drink alcohol, or use illegal drugs. Some items may interact with your medicine. What should I watch for while using this medicine? Visit your doctor or health care professional for regular check ups. See your doctor if you or your partner has sexual contact with others, becomes HIV positive, or gets a sexual transmitted disease. This product does not protect you against HIV infection (AIDS) or other sexually transmitted diseases. You can check the placement of the IUD yourself by reaching up to the top of your vagina with clean fingers to feel the threads. Do not pull on the threads. It is a good habit to check placement after each menstrual period. Call your doctor right away if you feel more of the IUD than just the threads or if you cannot feel the threads at all. The IUD may come out by itself. You may become pregnant if the device comes out. If you notice that the IUD has come out use a backup birth control method like condoms and call your health care provider. Using tampons will not change the position of the IUD and are okay to use during your period. What side effects may   I notice from receiving this medicine? Side effects that you should report to your doctor or  health care professional as soon as possible: -allergic reactions like skin rash, itching or hives, swelling of the face, lips, or tongue -fever, flu-like symptoms -genital sores -high blood pressure -no menstrual period for 6 weeks during use -pain, swelling, warmth in the leg -pelvic pain or tenderness -severe or sudden headache -signs of pregnancy -stomach cramping -sudden shortness of breath -trouble with balance, talking, or walking -unusual vaginal bleeding, discharge -yellowing of the eyes or skin Side effects that usually do not require medical attention (report to your doctor or health care professional if they continue or are bothersome): -acne -breast pain -change in sex drive or performance -changes in weight -cramping, dizziness, or faintness while the device is being inserted -headache -irregular menstrual bleeding within first 3 to 6 months of use -nausea This list may not describe all possible side effects. Call your doctor for medical advice about side effects. You may report side effects to FDA at 1-800-FDA-1088. Where should I keep my medicine? This does not apply. NOTE: This sheet is a summary. It may not cover all possible information. If you have questions about this medicine, talk to your doctor, pharmacist, or health care provider.  2015, [employer]. (2012-01-04 13:54:04)  

## 2014-10-14 ENCOUNTER — Encounter: Payer: Self-pay | Admitting: *Deleted

## 2014-10-14 LAB — PAP IG AND HPV HIGH-RISK: HPV DNA High Risk: NOT DETECTED

## 2014-10-19 ENCOUNTER — Encounter: Payer: Self-pay | Admitting: Obstetrics & Gynecology

## 2014-10-26 ENCOUNTER — Ambulatory Visit (INDEPENDENT_AMBULATORY_CARE_PROVIDER_SITE_OTHER): Payer: PRIVATE HEALTH INSURANCE | Admitting: Obstetrics & Gynecology

## 2014-10-26 ENCOUNTER — Encounter: Payer: Self-pay | Admitting: Obstetrics & Gynecology

## 2014-10-26 VITALS — BP 126/87 | HR 85 | Temp 98.9°F | Ht 69.0 in | Wt 269.0 lb

## 2014-10-26 DIAGNOSIS — Z01812 Encounter for preprocedural laboratory examination: Secondary | ICD-10-CM

## 2014-10-26 DIAGNOSIS — Z30430 Encounter for insertion of intrauterine contraceptive device: Secondary | ICD-10-CM

## 2014-10-26 DIAGNOSIS — Z3202 Encounter for pregnancy test, result negative: Secondary | ICD-10-CM

## 2014-10-26 LAB — POCT URINE PREGNANCY: PREG TEST UR: NEGATIVE

## 2014-10-26 MED ORDER — DOXYCYCLINE HYCLATE 100 MG PO CAPS: 100.0000 mg | ORAL_CAPSULE | Freq: Two times a day (BID) | ORAL | Status: DC

## 2014-10-26 NOTE — Progress Notes (Signed)
IUD Insertion Procedure Note BP 126/87 mmHg  Pulse 85  Temp(Src) 98.9 F (37.2 C)  Ht 5\' 9"  (1.753 m)  Wt 122.018 kg (269 lb)  BMI 39.71 kg/m2  LMP 10/21/2014  Breastfeeding? No  Pre-operative Diagnosis: Requests a LARC   Post-operative Diagnosis: same  Indications: contraception  Procedure Details  Urine pregnancy test was done and result was negative.  The risks (including infection, bleeding, pain, and uterine perforation) and benefits of the procedure were explained to the patient and Written informed consent was obtained.    Cervix cleansed with Betadine. Uterus sounded to 9 cm. IUD inserted without difficulty. String visible and trimmed. Patient tolerated procedure well.  A limited U/S showed the IUD in mid endometrial cavity, several cm caudad to the fundus.  The IUD was removed and a Liletta IUD was placed without difficulty. An informal U/S confirmed appropriate IUD positioning.  IUD Information: Liletta  Condition: Stable  Complications: None  Plan:  The patient was advised to call for any fever or for prolonged or severe pain or bleeding. She was advised to use OTC acetaminophen as needed for mild to moderate pain.   Meds ordered this encounter  Medications  . doxycycline (VIBRAMYCIN) 100 MG capsule    Sig: Take 1 capsule (100 mg total) by mouth 2 (two) times daily.    Dispense:  6 capsule    Refill:  0

## 2014-10-26 NOTE — Patient Instructions (Signed)

## 2014-11-16 ENCOUNTER — Ambulatory Visit (INDEPENDENT_AMBULATORY_CARE_PROVIDER_SITE_OTHER): Payer: PRIVATE HEALTH INSURANCE | Admitting: Obstetrics & Gynecology

## 2014-11-16 VITALS — BP 145/96 | HR 97 | Wt 269.0 lb

## 2014-11-16 DIAGNOSIS — N8320 Unspecified ovarian cysts: Secondary | ICD-10-CM

## 2014-11-16 DIAGNOSIS — N939 Abnormal uterine and vaginal bleeding, unspecified: Secondary | ICD-10-CM

## 2014-11-16 DIAGNOSIS — N83202 Unspecified ovarian cyst, left side: Secondary | ICD-10-CM

## 2014-11-16 DIAGNOSIS — Z30431 Encounter for routine checking of intrauterine contraceptive device: Secondary | ICD-10-CM

## 2014-11-16 MED ORDER — MEDROXYPROGESTERONE ACETATE 10 MG PO TABS: 20.0000 mg | ORAL_TABLET | Freq: Three times a day (TID) | ORAL | Status: DC

## 2014-11-18 ENCOUNTER — Encounter: Payer: Self-pay | Admitting: Obstetrics & Gynecology

## 2014-11-18 NOTE — Progress Notes (Signed)
Patient ID: Annette Miranda, female   DOB: 1981/07/04, 33 y.o.   MRN: [id number]  Chief Complaint  Patient presents with  . Menstrual Problem    HPI Annette Miranda is a 33 y.o. female.  She reports a prolonged bleeding episode since insertion of the IUD.  She also reports associated lower back pain for several days.  HPI  Past Medical History  Diagnosis Date  . Pregnancy induced hypertension   . Fibroid   . Migraines     Past Surgical History  Procedure Laterality Date  . Cholecystectomy      Family History  Problem Relation Age of Onset  . Hypertension Mother   . Hypertension Father   . Hypertension Maternal Grandmother   . Cancer Maternal Grandmother   . Hypertension Maternal Grandfather   . Hypertension Paternal Grandmother   . Hypertension Paternal Grandfather   . Cancer Paternal Grandfather     Social History History  Substance Use Topics  . Smoking status: Former Smoker    Types: Cigarettes    Quit date: 02/13/2014  . Smokeless tobacco: Not on file     Comment: trying to quit  . Alcohol Use: No    No Known Allergies  Current Outpatient Prescriptions  Medication Sig Dispense Refill  . doxycycline (VIBRAMYCIN) 100 MG capsule Take 1 capsule (100 mg total) by mouth 2 (two) times daily. 6 capsule 0  . fluticasone (FLONASE) 50 MCG/ACT nasal spray Place into both nostrils daily.    . medroxyPROGESTERone (PROVERA) 10 MG tablet Take 2 tablets (20 mg total) by mouth 3 (three) times daily. For 7 days 42 tablet 0  . SUMAtriptan (IMITREX) 50 MG tablet Take 1 tablet (50 mg total) by mouth every 2 (two) hours as needed for migraine or headache. May repeat in 2 hours if headache persists or recurs. 10 tablet 2  . topiramate (TOPAMAX) 50 MG tablet Take 25 mg daily for 7 days. Then increase to 50 mg daily. 30 tablet 2   No current facility-administered medications for this visit.    Review of Systems Review of Systems Constitutional: negative for fatigue and weight  loss Respiratory: negative for cough and wheezing Cardiovascular: negative for chest pain, fatigue and palpitations Gastrointestinal: negative for abdominal pain and change in bowel habits Genitourinary: positive for abnormal uterine bleeding Integument/breast: negative for nipple discharge Musculoskeletal:negative for myalgias Neurological: negative for gait problems and tremors Behavioral/Psych: negative for abusive relationship, depression Endocrine: negative for temperature intolerance     Blood pressure 145/96, pulse 97, weight 122.018 kg (269 lb), last menstrual period 10/21/2014, not currently breastfeeding.  Physical Exam Physical Exam General:   alert  Skin:   no rash or abnormalities  Lungs:   clear to auscultation bilaterally  Heart:   regular rate and rhythm, S1, S2 normal, no murmur, click, rub or gallop  Abdomen:  normal findings: no organomegaly, soft, non-tender and no hernia  Pelvis:  External genitalia: normal general appearance Urinary system: urethral meatus normal and bladder without fullness, nontender Vaginal: moderate heme in the vault Cervix: normal appearance Adnexa: normal bimanual exam Uterus: anteverted and non-tender, normal size    Limited U/S w/3-D coronal imaging--thickened endometrium--homogeneous/?clots in LUS; IUD in an appropriate position; unilocular, left-sided adnexal cyst-- 4 cm   Data Reviewed See above  Assessment    Prolonged bleeding episode s/p IUD placement Ovarian cyst     Plan     Meds ordered this encounter  Medications  . DISCONTD: medroxyPROGESTERone (PROVERA) 10 MG tablet  Sig: Take 2 tablets (20 mg total) by mouth 3 (three) times daily. For 7 days    Dispense:  10 tablet    Refill:  0  . medroxyPROGESTERone (PROVERA) 10 MG tablet    Sig: Take 2 tablets (20 mg total) by mouth 3 (three) times daily. For 7 days    Dispense:  42 tablet    Refill:  0    Follow up in 2 weeks or as needed          [name],Jasenia Weilbacher A 11/18/2014, 11:46 AM

## 2014-11-18 NOTE — Patient Instructions (Signed)
Abnormal Uterine Bleeding Abnormal uterine bleeding can affect women at various stages in life, including teenagers, women in their reproductive years, pregnant women, and women who have reached menopause. Several kinds of uterine bleeding are considered abnormal, including:  Bleeding or spotting between periods.   Bleeding after sexual intercourse.   Bleeding that is heavier or more than normal.   Periods that last longer than usual.  Bleeding after menopause.  Many cases of abnormal uterine bleeding are minor and simple to treat, while others are more serious. Any type of abnormal bleeding should be evaluated by your health care provider. Treatment will depend on the cause of the bleeding. HOME CARE INSTRUCTIONS Monitor your condition for any changes. The following actions may help to alleviate any discomfort you are experiencing:  Avoid the use of tampons and douches as directed by your health care provider.  Change your pads frequently. You should get regular pelvic exams and Pap tests. Keep all follow-up appointments for diagnostic tests as directed by your health care provider.  SEEK MEDICAL CARE IF:   Your bleeding lasts more than 1 week.   You feel dizzy at times.  SEEK IMMEDIATE MEDICAL CARE IF:   You pass out.   You are changing pads every 15 to 30 minutes.   You have abdominal pain.  You have a fever.   You become sweaty or weak.   You are passing large blood clots from the vagina.   You start to feel nauseous and vomit. MAKE SURE YOU:   Understand these instructions.  Will watch your condition.  Will get help right away if you are not doing well or get worse. Document Released: 12/04/2005 Document Revised: 12/09/2013 Document Reviewed: 07/03/2013 [facility] Patient Information 2015 [place], [place]. This information is not intended to replace advice given to you by your health care provider. Make sure you discuss any questions you have with your  health care provider. Ovarian Cyst An ovarian cyst is a fluid-filled sac that forms on an ovary. The ovaries are small organs that produce eggs in women. Various types of cysts can form on the ovaries. Most are not cancerous. Many do not cause problems, and they often go away on their own. Some may cause symptoms and require treatment. Common types of ovarian cysts include:  Functional cysts--These cysts may occur every month during the menstrual cycle. This is normal. The cysts usually go away with the next menstrual cycle if the woman does not get pregnant. Usually, there are no symptoms with a functional cyst.  Endometrioma cysts--These cysts form from the tissue that lines the uterus. They are also called "chocolate cysts" because they become filled with blood that turns brown. This type of cyst can cause pain in the lower abdomen during intercourse and with your menstrual period.  Cystadenoma cysts--This type develops from the cells on the outside of the ovary. These cysts can get very big and cause lower abdomen pain and pain with intercourse. This type of cyst can twist on itself, cut off its blood supply, and cause severe pain. It can also easily rupture and cause a lot of pain.  Dermoid cysts--This type of cyst is sometimes found in both ovaries. These cysts may contain different kinds of body tissue, such as skin, teeth, hair, or cartilage. They usually do not cause symptoms unless they get very big.  Theca lutein cysts--These cysts occur when too much of a certain hormone (human chorionic gonadotropin) is produced and overstimulates the ovaries to produce an  egg. This is most common after procedures used to assist with the conception of a baby (in vitro fertilization). CAUSES   Fertility drugs can cause a condition in which multiple large cysts are formed on the ovaries. This is called ovarian hyperstimulation syndrome.  A condition called polycystic ovary syndrome can cause hormonal  imbalances that can lead to nonfunctional ovarian cysts. SIGNS AND SYMPTOMS  Many ovarian cysts do not cause symptoms. If symptoms are present, they may include:  Pelvic pain or pressure.  Pain in the lower abdomen.  Pain during sexual intercourse.  Increasing girth (swelling) of the abdomen.  Abnormal menstrual periods.  Increasing pain with menstrual periods.  Stopping having menstrual periods without being pregnant. DIAGNOSIS  These cysts are commonly found during a routine or annual pelvic exam. Tests may be ordered to find out more about the cyst. These tests may include:  Ultrasound.  X-ray of the pelvis.  CT scan.  MRI.  Blood tests. TREATMENT  Many ovarian cysts go away on their own without treatment. Your health care provider may want to check your cyst regularly for 2-3 months to see if it changes. For women in menopause, it is particularly important to monitor a cyst closely because of the higher rate of ovarian cancer in menopausal women. When treatment is needed, it may include any of the following:  A procedure to drain the cyst (aspiration). This may be done using a long needle and ultrasound. It can also be done through a laparoscopic procedure. This involves using a thin, lighted tube with a tiny camera on the end (laparoscope) inserted through a small incision.  Surgery to remove the whole cyst. This may be done using laparoscopic surgery or an open surgery involving a larger incision in the lower abdomen.  Hormone treatment or birth control pills. These methods are sometimes used to help dissolve a cyst. HOME CARE INSTRUCTIONS   Only take over-the-counter or prescription medicines as directed by your health care provider.  Follow up with your health care provider as directed.  Get regular pelvic exams and Pap tests. SEEK MEDICAL CARE IF:   Your periods are late, irregular, or painful, or they stop.  Your pelvic pain or abdominal pain does not go  away.  Your abdomen becomes larger or swollen.  You have pressure on your bladder or trouble emptying your bladder completely.  You have pain during sexual intercourse.  You have feelings of fullness, pressure, or discomfort in your stomach.  You lose weight for no apparent reason.  You feel generally ill.  You become constipated.  You lose your appetite.  You develop acne.  You have an increase in body and facial hair.  You are gaining weight, without changing your exercise and eating habits.  You think you are pregnant. SEEK IMMEDIATE MEDICAL CARE IF:   You have increasing abdominal pain.  You feel sick to your stomach (nauseous), and you throw up (vomit).  You develop a fever that comes on suddenly.  You have abdominal pain during a bowel movement.  Your menstrual periods become heavier than usual. MAKE SURE YOU:  Understand these instructions.  Will watch your condition.  Will get help right away if you are not doing well or get worse. Document Released: 12/04/2005 Document Revised: 12/09/2013 Document Reviewed: 08/11/2013 [facility] Patient Information 2015 [place], [place]. This information is not intended to replace advice given to you by your health care provider. Make sure you discuss any questions you have with your health care  provider.  

## 2014-11-23 ENCOUNTER — Encounter: Payer: Self-pay | Admitting: Obstetrics & Gynecology

## 2014-11-23 ENCOUNTER — Ambulatory Visit: Payer: PRIVATE HEALTH INSURANCE | Admitting: Obstetrics & Gynecology

## 2014-11-23 VITALS — BP 142/87 | HR 108 | Temp 98.9°F | Ht 69.5 in | Wt 266.0 lb

## 2014-11-23 DIAGNOSIS — N939 Abnormal uterine and vaginal bleeding, unspecified: Secondary | ICD-10-CM

## 2014-11-23 DIAGNOSIS — D259 Leiomyoma of uterus, unspecified: Secondary | ICD-10-CM

## 2014-11-23 LAB — CBC
HCT: 36 % (ref 36.0–46.0)
Hemoglobin: 12.5 g/dL (ref 12.0–15.0)
MCH: 30 pg (ref 26.0–34.0)
MCHC: 34.7 g/dL (ref 30.0–36.0)
MCV: 86.3 fL (ref 78.0–100.0)
MPV: 9.4 fL (ref 9.4–12.4)
PLATELETS: 316 10*3/uL (ref 150–400)
RBC: 4.17 MIL/uL (ref 3.87–5.11)
RDW: 14.5 % (ref 11.5–15.5)
WBC: 9.3 10*3/uL (ref 4.0–10.5)

## 2014-11-23 MED ORDER — TRANEXAMIC ACID 650 MG PO TABS: 1300.0000 mg | ORAL_TABLET | Freq: Three times a day (TID) | ORAL | Status: DC

## 2014-11-23 NOTE — Patient Instructions (Signed)
Dilation and Curettage or Vacuum Curettage Dilation and curettage (D&C) and vacuum curettage are minor procedures. A D&C involves stretching (dilation) the cervix and scraping (curettage) the inside lining of the womb (uterus). During a D&C, tissue is gently scraped from the inside lining of the uterus. During a vacuum curettage, the lining and tissue in the uterus are removed with the use of gentle suction.  Curettage may be performed to either diagnose or treat a problem. As a diagnostic procedure, curettage is performed to examine tissues from the uterus. A diagnostic curettage may be performed for the following symptoms:   Irregular bleeding in the uterus.   Bleeding with the development of clots.   Spotting between menstrual periods.   Prolonged menstrual periods.   Bleeding after menopause.   No menstrual period (amenorrhea).   A change in size and shape of the uterus.  As a treatment procedure, curettage may be performed for the following reasons:   Removal of an IUD (intrauterine device).   Removal of retained placenta after giving birth. Retained placenta can cause an infection or bleeding severe enough to require transfusions.   Abortion.   Miscarriage.   Removal of polyps inside the uterus.   Removal of uncommon types of noncancerous lumps (fibroids).  LET YOUR HEALTH CARE PROVIDER KNOW ABOUT:   Any allergies you have.   All medicines you are taking, including vitamins, herbs, eye drops, creams, and over-the-counter medicines.   Previous problems you or members of your family have had with the use of anesthetics.   Any blood disorders you have.   Previous surgeries you have had.   Medical conditions you have. RISKS AND COMPLICATIONS  Generally, this is a safe procedure. However, as with any procedure, complications can occur. Possible complications include:  Excessive bleeding.   Infection of the uterus.   Damage to the cervix.    Development of scar tissue (adhesions) inside the uterus, later causing abnormal amounts of menstrual bleeding.   Complications from the general anesthetic, if a general anesthetic is used.   Putting a hole (perforation) in the uterus. This is rare.  BEFORE THE PROCEDURE   Eat and drink before the procedure only as directed by your health care provider.   Arrange for someone to take you home.  PROCEDURE  This procedure usually takes about 15-30 minutes.  You will be given one of the following:  A medicine that numbs the area in and around the cervix (local anesthetic).   A medicine to make you sleep through the procedure (general anesthetic).  You will lie on your back with your legs in stirrups.   A warm metal or plastic instrument (speculum) will be placed in your vagina to keep it open and to allow the health care provider to see the cervix.  There are two ways in which your cervix can be softened and dilated. These include:   Taking a medicine.   Having thin rods (laminaria) inserted into your cervix.   A curved tool (curette) will be used to scrape cells from the inside lining of the uterus. In some cases, gentle suction is applied with the curette. The curette will then be removed.  AFTER THE PROCEDURE   You will rest in the recovery area until you are stable and are ready to go home.   You may feel sick to your stomach (nauseous) or throw up (vomit) if you were given a general anesthetic.   You may have a sore throat if a tube   was placed in your throat during general anesthesia.   You may have light cramping and bleeding. This may last for 2 days to 2 weeks after the procedure.   Your uterus needs to make a new lining after the procedure. This may make your next period late. Document Released: 12/04/2005 Document Revised: 08/06/2013 Document Reviewed: 07/03/2013 [employer] Patient Information 2015 [employer]. This information is not intended to  replace advice given to you by your health care provider. Make sure you discuss any questions you have with your health care provider.  

## 2014-11-23 NOTE — Progress Notes (Signed)
Patient ID: Annette Miranda, female   DOB: 16-Feb-1981, 33 y.o.   MRN: [id number]  Chief Complaint  Patient presents with  . Vaginal Bleeding    Patient states provera has not helped to stopped the bleeding.     HPI Annette Miranda is a 33 y.o. female.  See above.  HPI  Past Medical History  Diagnosis Date  . Pregnancy induced hypertension   . Fibroid   . Migraines     Past Surgical History  Procedure Laterality Date  . Cholecystectomy      Family History  Problem Relation Age of Onset  . Hypertension Mother   . Hypertension Father   . Hypertension Maternal Grandmother   . Cancer Maternal Grandmother   . Hypertension Maternal Grandfather   . Hypertension Paternal Grandmother   . Hypertension Paternal Grandfather   . Cancer Paternal Grandfather     Social History History  Substance Use Topics  . Smoking status: Former Smoker    Types: Cigarettes    Quit date: 02/13/2014  . Smokeless tobacco: Not on file     Comment: trying to quit  . Alcohol Use: No    No Known Allergies  Current Outpatient Prescriptions  Medication Sig Dispense Refill  . fluticasone (FLONASE) 50 MCG/ACT nasal spray Place into both nostrils daily.    . medroxyPROGESTERone (PROVERA) 10 MG tablet Take 2 tablets (20 mg total) by mouth 3 (three) times daily. For 7 days 42 tablet 0  . SUMAtriptan (IMITREX) 50 MG tablet Take 1 tablet (50 mg total) by mouth every 2 (two) hours as needed for migraine or headache. May repeat in 2 hours if headache persists or recurs. 10 tablet 2  . topiramate (TOPAMAX) 50 MG tablet Take 25 mg daily for 7 days. Then increase to 50 mg daily. 30 tablet 2   No current facility-administered medications for this visit.    Review of Systems Review of Systems Constitutional: negative for fatigue and weight loss Respiratory: negative for cough and wheezing Cardiovascular: negative for chest pain, fatigue and palpitations Gastrointestinal: negative for abdominal pain and change in  bowel habits Genitourinary: positive for abnormal bleeding Integument/breast: negative for nipple discharge Musculoskeletal:negative for myalgias Neurological: negative for gait problems and tremors Behavioral/Psych: negative for abusive relationship, depression Endocrine: negative for temperature intolerance     Blood pressure 142/87, pulse 108, temperature 98.9 F (37.2 C), height 5' 9.5" (1.765 m), weight 120.657 kg (266 lb), last menstrual period 10/21/2014, not currently breastfeeding.  Physical Exam Physical Exam General:   alert  Skin:   no rash or abnormalities  Lungs:   clear to auscultation bilaterally  Heart:   regular rate and rhythm, S1, S2 normal, no murmur, click, rub or gallop  Abdomen:  normal findings: no organomegaly, soft, non-tender and no hernia  Pelvis:  External genitalia: normal general appearance Urinary system: urethral meatus normal and bladder without fullness, nontender Vaginal: normal without tenderness, induration or masses; scant heme Cervix: normal appearance Adnexa: normal bimanual exam Uterus: anteverted and non-tender, normal size      Data Reviewed None  Assessment    Prolonged episode of bleeding--refractory to Provera    Plan    Possible management options include: Lysteda, therapeutic D&C Follow up as needed.         [name],Emalene Welte A 11/23/2014, 1:27 PM

## 2014-11-24 ENCOUNTER — Other Ambulatory Visit: Payer: Self-pay | Admitting: *Deleted

## 2014-11-24 LAB — APTT: aPTT: 29 seconds (ref 24–37)

## 2014-11-24 LAB — PROTIME-INR
INR: 0.99 (ref <1.50)
PROTHROMBIN TIME: 13.1 s (ref 11.6–15.2)

## 2014-11-25 ENCOUNTER — Ambulatory Visit (HOSPITAL_COMMUNITY): Payer: No Typology Code available for payment source | Admitting: Anesthesiology

## 2014-11-25 ENCOUNTER — Encounter (HOSPITAL_COMMUNITY): Payer: Self-pay | Admitting: Anesthesiology

## 2014-11-25 ENCOUNTER — Ambulatory Visit (HOSPITAL_COMMUNITY)
Admission: RE | Admit: 2014-11-25 | Discharge: 2014-11-25 | Disposition: A | Discharge status: Home / Self Care | Payer: No Typology Code available for payment source | Source: Ambulatory Visit | Attending: Obstetrics & Gynecology | Admitting: Obstetrics & Gynecology

## 2014-11-25 ENCOUNTER — Encounter (HOSPITAL_COMMUNITY)
Admission: RE | Disposition: A | Discharge status: Home / Self Care | Payer: Self-pay | Source: Ambulatory Visit | Attending: Obstetrics & Gynecology

## 2014-11-25 DIAGNOSIS — Z30433 Encounter for removal and reinsertion of intrauterine contraceptive device: Secondary | ICD-10-CM | POA: Insufficient documentation

## 2014-11-25 DIAGNOSIS — Z30432 Encounter for removal of intrauterine contraceptive device: Secondary | ICD-10-CM

## 2014-11-25 DIAGNOSIS — Z30430 Encounter for insertion of intrauterine contraceptive device: Secondary | ICD-10-CM

## 2014-11-25 DIAGNOSIS — N939 Abnormal uterine and vaginal bleeding, unspecified: Secondary | ICD-10-CM | POA: Diagnosis present

## 2014-11-25 DIAGNOSIS — F17210 Nicotine dependence, cigarettes, uncomplicated: Secondary | ICD-10-CM | POA: Insufficient documentation

## 2014-11-25 DIAGNOSIS — D219 Benign neoplasm of connective and other soft tissue, unspecified: Secondary | ICD-10-CM | POA: Insufficient documentation

## 2014-11-25 DIAGNOSIS — Z8249 Family history of ischemic heart disease and other diseases of the circulatory system: Secondary | ICD-10-CM | POA: Insufficient documentation

## 2014-11-25 DIAGNOSIS — Z809 Family history of malignant neoplasm, unspecified: Secondary | ICD-10-CM | POA: Insufficient documentation

## 2014-11-25 DIAGNOSIS — G43909 Migraine, unspecified, not intractable, without status migrainosus: Secondary | ICD-10-CM | POA: Insufficient documentation

## 2014-11-25 HISTORY — PX: DILATION AND CURETTAGE OF UTERUS: SHX78

## 2014-11-25 LAB — PREGNANCY, URINE: PREG TEST UR: NEGATIVE

## 2014-11-25 SURGERY — DILATION AND CURETTAGE
Anesthesia: Monitor Anesthesia Care

## 2014-11-25 MED ORDER — LACTATED RINGERS IV SOLN
INTRAVENOUS | Status: DC
Start: 2014-11-25 — End: 2014-11-25
  Administered 2014-11-25 (×3): via INTRAVENOUS

## 2014-11-25 MED ORDER — ACETAMINOPHEN 325 MG PO TABS: 650.0000 mg | ORAL_TABLET | ORAL | Status: DC | PRN

## 2014-11-25 MED ORDER — SCOPOLAMINE 1 MG/3DAYS TD PT72
MEDICATED_PATCH | TRANSDERMAL | Status: AC
  Filled 2014-11-25: qty 1

## 2014-11-25 MED ORDER — KETOROLAC TROMETHAMINE 30 MG/ML IJ SOLN
INTRAMUSCULAR | Status: AC
  Filled 2014-11-25: qty 1

## 2014-11-25 MED ORDER — DEXAMETHASONE SODIUM PHOSPHATE 4 MG/ML IJ SOLN
INTRAMUSCULAR | Status: AC
  Filled 2014-11-25: qty 1

## 2014-11-25 MED ORDER — FENTANYL CITRATE 0.05 MG/ML IJ SOLN
INTRAMUSCULAR | Status: AC
  Filled 2014-11-25: qty 2

## 2014-11-25 MED ORDER — SODIUM CHLORIDE 0.9 % IJ SOLN: 3.0000 mL | INTRAMUSCULAR | Status: DC | PRN

## 2014-11-25 MED ORDER — SODIUM CHLORIDE 0.9 % IJ SOLN: 3.0000 mL | Freq: Two times a day (BID) | INTRAMUSCULAR | Status: DC

## 2014-11-25 MED ORDER — FENTANYL CITRATE 0.05 MG/ML IJ SOLN
25.0000 ug | INTRAMUSCULAR | Status: DC | PRN
Start: 2014-11-25 — End: 2014-11-25

## 2014-11-25 MED ORDER — DEXAMETHASONE SODIUM PHOSPHATE 4 MG/ML IJ SOLN
INTRAMUSCULAR | Status: DC | PRN
  Administered 2014-11-25: 4 mg via INTRAVENOUS

## 2014-11-25 MED ORDER — PROPOFOL 10 MG/ML IV EMUL
INTRAVENOUS | Status: AC
  Filled 2014-11-25: qty 20

## 2014-11-25 MED ORDER — ACETAMINOPHEN 650 MG RE SUPP
650.0000 mg | RECTAL | Status: DC | PRN
  Filled 2014-11-25: qty 1

## 2014-11-25 MED ORDER — KETOROLAC TROMETHAMINE 30 MG/ML IJ SOLN: 15.0000 mg | Freq: Once | INTRAMUSCULAR | Status: DC | PRN

## 2014-11-25 MED ORDER — ONDANSETRON HCL 4 MG/2ML IJ SOLN
INTRAMUSCULAR | Status: DC | PRN
  Administered 2014-11-25: 4 mg via INTRAVENOUS

## 2014-11-25 MED ORDER — MIDAZOLAM HCL 2 MG/2ML IJ SOLN
INTRAMUSCULAR | Status: AC
  Filled 2014-11-25: qty 2

## 2014-11-25 MED ORDER — DEXAMETHASONE SODIUM PHOSPHATE 10 MG/ML IJ SOLN
INTRAMUSCULAR | Status: AC
  Filled 2014-11-25: qty 1

## 2014-11-25 MED ORDER — ONDANSETRON HCL 4 MG/2ML IJ SOLN
INTRAMUSCULAR | Status: AC
  Filled 2014-11-25: qty 2

## 2014-11-25 MED ORDER — PROPOFOL 10 MG/ML IV EMUL
INTRAVENOUS | Status: DC | PRN
  Administered 2014-11-25 (×2): 20 mg via INTRAVENOUS
  Administered 2014-11-25 (×3): 30 mg via INTRAVENOUS
  Administered 2014-11-25: 20 mg via INTRAVENOUS

## 2014-11-25 MED ORDER — LIDOCAINE HCL (CARDIAC) 20 MG/ML IV SOLN
INTRAVENOUS | Status: AC
  Filled 2014-11-25: qty 5

## 2014-11-25 MED ORDER — LIDOCAINE HCL 1 % IJ SOLN
INTRAMUSCULAR | Status: AC
  Filled 2014-11-25: qty 20

## 2014-11-25 MED ORDER — MIDAZOLAM HCL 2 MG/2ML IJ SOLN
INTRAMUSCULAR | Status: DC | PRN
  Administered 2014-11-25: 2 mg via INTRAVENOUS

## 2014-11-25 MED ORDER — 0.9 % SODIUM CHLORIDE (POUR BTL) OPTIME
TOPICAL | Status: DC | PRN
  Administered 2014-11-25: 1000 mL

## 2014-11-25 MED ORDER — MEPERIDINE HCL 25 MG/ML IJ SOLN: 6.2500 mg | INTRAMUSCULAR | Status: DC | PRN

## 2014-11-25 MED ORDER — SCOPOLAMINE 1 MG/3DAYS TD PT72
1.0000 | MEDICATED_PATCH | Freq: Once | TRANSDERMAL | Status: DC
  Administered 2014-11-25: 1.5 mg via TRANSDERMAL

## 2014-11-25 MED ORDER — KETOROLAC TROMETHAMINE 30 MG/ML IJ SOLN
INTRAMUSCULAR | Status: DC | PRN
  Administered 2014-11-25: 30 mg via INTRAVENOUS

## 2014-11-25 MED ORDER — FENTANYL CITRATE 0.05 MG/ML IJ SOLN
INTRAMUSCULAR | Status: DC | PRN
Start: 2014-11-25 — End: 2014-11-25
  Administered 2014-11-25 (×3): 50 ug via INTRAVENOUS

## 2014-11-25 MED ORDER — LIDOCAINE HCL (CARDIAC) 20 MG/ML IV SOLN
INTRAVENOUS | Status: DC | PRN
  Administered 2014-11-25: 10 mg via INTRAVENOUS
  Administered 2014-11-25: 20 mg via INTRAVENOUS

## 2014-11-25 MED ORDER — OXYCODONE HCL 5 MG PO TABS: 5.0000 mg | ORAL_TABLET | ORAL | Status: DC | PRN

## 2014-11-25 MED ORDER — OXYCODONE-ACETAMINOPHEN 5-325 MG PO TABS: 2.0000 | ORAL_TABLET | Freq: Four times a day (QID) | ORAL | Status: DC | PRN

## 2014-11-25 MED ORDER — PROPOFOL INFUSION 10 MG/ML OPTIME
INTRAVENOUS | Status: DC | PRN
  Administered 2014-11-25: 100 ug/kg/min via INTRAVENOUS

## 2014-11-25 MED ORDER — PROMETHAZINE HCL 25 MG/ML IJ SOLN: 6.2500 mg | INTRAMUSCULAR | Status: DC | PRN

## 2014-11-25 MED ORDER — SODIUM CHLORIDE 0.9 % IV SOLN: 250.0000 mL | INTRAVENOUS | Status: DC | PRN

## 2014-11-25 MED ORDER — LIDOCAINE HCL 1 % IJ SOLN
INTRAMUSCULAR | Status: DC | PRN
  Administered 2014-11-25: 10 mL

## 2014-11-25 SURGICAL SUPPLY — 11 items
CATH ROBINSON RED A/P 16FR (CATHETERS) ×2 IMPLANT
CLOTH BEACON ORANGE TIMEOUT ST (SAFETY) ×2 IMPLANT
CONTAINER PREFILL 10% NBF 60ML (FORM) ×2 IMPLANT
GLOVE BIO SURGEON STRL SZ 6.5 (GLOVE) ×2 IMPLANT
GLOVE INDICATOR 6.5 STRL GRN (GLOVE) ×2 IMPLANT
GOWN STRL REUS W/TWL LRG LVL3 (GOWN DISPOSABLE) ×4 IMPLANT
PACK VAGINAL MINOR WOMEN LF (CUSTOM PROCEDURE TRAY) ×2 IMPLANT
PAD OB MATERNITY 4.3X12.25 (PERSONAL CARE ITEMS) ×2 IMPLANT
PAD PREP 24X48 CUFFED NSTRL (MISCELLANEOUS) ×2 IMPLANT
SCRUB PCMX 4 OZ (MISCELLANEOUS) ×2 IMPLANT
TOWEL OR 17X24 6PK STRL BLUE (TOWEL DISPOSABLE) ×4 IMPLANT

## 2014-11-25 NOTE — Op Note (Signed)
Preoperative diagnosis: Prolonged bleeding episode refractory to medical management  Postoperative diagnosis: Same  Procedure: Diagnostic/therapeutic dilatation and curettage; Mirena IUD removal/insertion  Surgeon: Lahoma Crocker A  Anesthesia: Managed anesthesia care, paracervical block  Estimated blood loss: 100 ml  Urine output: Minimal  IV Fluids: per Anesthesiology  Complications: None  Specimen: PATHOLOGY  Operative Findings: Moderate to increased endometrial curettings  Description of procedure:   The patient was taken to the operating room and placed on the operating table in the semi-lithotomy position in [place].  Examination under anesthesia was performed.  The patient was prepped and draped in the usual manner.  After a time-out had been completed, a speculum was placed in the vagina.  The IUD was removed intact.  The anterior lip of the cervix was grasped with a single-toothed tenaculum.    10 cc of 1% lidocaine were injected at 4 and 7 o'clock to produce a paracervical block.  The uterine cavity sounded to 11 cm.  The endocervical canal was dilated with Kennon Rounds dilators.  A small, Sims curette was used to perform an endometrial curettage.  A Mirena IUD was inserted.  All the instruments were removed from the vagina.  Final instrument counts were correct.  The patient was taken to the PACU in stable condition.

## 2014-11-25 NOTE — Anesthesia Postprocedure Evaluation (Signed)
  Anesthesia Post-op Note  Anesthesia Post Note  Patient: Annette Miranda  Procedure(s) Performed: Procedure(s) (LRB): DILATATION AND CURETTAGE (N/A)  Anesthesia type: MAC  Patient location: PACU  Post pain: Pain level controlled  Post assessment: Post-op Vital signs reviewed  Last Vitals:  Filed Vitals:   11/25/14 1252  BP:   Pulse: 67  Temp: 36.8 C  Resp: 17    Post vital signs: Reviewed  Level of consciousness: sedated  Complications: No apparent anesthesia complications

## 2014-11-25 NOTE — Anesthesia Preprocedure Evaluation (Addendum)
Anesthesia Evaluation  Patient identified by MRN, date of birth, ID band Patient awake    Reviewed: Allergy & Precautions, H&P , NPO status , Patient's Chart, lab work & pertinent test results, reviewed documented beta blocker date and time   History of Anesthesia Complications Negative for: history of anesthetic complications  Airway Mallampati: I   Neck ROM: full  Mouth opening: Limited Mouth Opening  Dental  (+) Teeth Intact   Pulmonary Current Smoker,  breath sounds clear to auscultation  Pulmonary exam normal       Cardiovascular Rhythm:regular Rate:Normal     Neuro/Psych  Headaches (currently has a HA), negative psych ROS   GI/Hepatic negative GI ROS, Neg liver ROS,   Endo/Other  negative endocrine ROSBMI 39  Renal/GU negative Renal ROS  Female GU complaint     Musculoskeletal   Abdominal   Peds  Hematology negative hematology ROS (+)   Anesthesia Other Findings   Reproductive/Obstetrics negative OB ROS                            Anesthesia Physical Anesthesia Plan  ASA: II  Anesthesia Plan: MAC   Post-op Pain Management:    Induction:   Airway Management Planned:   Additional Equipment:   Intra-op Plan:   Post-operative Plan:   Informed Consent: I have reviewed the patients History and Physical, chart, labs and discussed the procedure including the risks, benefits and alternatives for the proposed anesthesia with the patient or authorized representative who has indicated his/her understanding and acceptance.   Dental Advisory Given  Plan Discussed with: CRNA and Surgeon  Anesthesia Plan Comments:         Anesthesia Quick Evaluation

## 2014-11-25 NOTE — Discharge Instructions (Signed)
D&C or Vacuum Curettage Care After Read the instructions below. Refer to this sheet in the next few weeks. These instructions provide you with general information on caring for yourself after you leave the hospital. Your caregiver may also give you specific instructions.  D&C or vacuum curettage is a minor operation. A D&C involves the stretching (dilatation) of the cervix and scraping (curettage) of the inside lining of the uterus. A vacuum curettage gently sucks out the lining and tissue in the uterus with a tube. You may have light cramping and bleeding for a couple of days to two weeks after the procedure. This procedure may be done in a hospital, outpatient clinic, or doctor's office. You may be given a drug to make you sleep (general anesthetic) or a drug that numbs the area (local anesthetic) in and around the cervix. HOME CARE INSTRUCTIONS  Do not drive for 24 hours.   Wait one week before returning to strenuous activities.   Take your temperature two times a day for 4 days and write it down. Provide these temperatures to your caregiver if they are abnormal (above 98.6 F or 37.0 C).   Avoid long periods of standing, and do no heavy lifting (more than 10 pounds), pushing or pulling.   Limit stair climbing to once or twice a day.   Take rest periods often.   You may resume your usual diet.   Drink plenty of fluids (6-8 glasses a day).   You should return to your usual bowel function. If constipation should occur, you may:   Take a mild laxative with permission from your caregiver.   Add fruit and bran to your diet.   Drink more fluids. This helps with constipation.   Take showers instead of baths until your caregiver gives you permission to take baths.   Do not go swimming or use a hot tub until your caregiver gives you permission.   Try to have someone with you or available for you the first 24 to 48 hours, especially if you had a general anesthetic.   Do not douche, use  tampons, or have intercourse until after your follow-up appointment, or when your caregiver approves.   Only take over-the-counter or prescription medicines for pain, discomfort, or fever as directed by your caregiver. Do not take aspirin. It can cause bleeding.   If a prescription was given, follow your caregiver's directions. You may be given a medicine that kills germs (antibiotic) to prevent an infection.   Keep all your follow-up appointments recommended by your caregiver.  SEEK MEDICAL CARE IF:  You have increasing cramps or pain not relieved with medication.   You develop belly (abdominal) pain which does not seem to be related to the same area of earlier cramping and pain.   You feel dizzy or feel like fainting.   You have bad smelling vaginal discharge.   You develop a rash.   You develop a reaction or allergy to your medication.  SEEK IMMEDIATE MEDICAL CARE IF:  Bleeding is heavier than a normal menstrual period.   You have an oral temperature above 100.6, not controlled by medicine.   You develop chest pain.   You develop shortness of breath.   You pass out.   You develop pain in your shoulder strap area.   You develop heavy vaginal bleeding with or without blood clots.  MAKE SURE YOU:   Understand these instructions.   Will watch your condition.   Will get help right away if  you are not doing well or get worse.  UPDATED HEALTH PRACTICES  A PAP smear is done to screen for cervical cancer.   The first PAP smear should be done at age 48.   Between ages 27 and 73, PAP smears are repeated every 2 years.   Beginning at age 92, you are advised to have a PAP smear every 3 years as long as your past 3 PAP smears have been normal.   Some women have medical problems that increase the chance of getting cervical cancer. Talk to your caregiver about these problems. It is especially important to talk to your caregiver if a new problem develops soon after your last PAP  smear. In these cases, your caregiver may recommend more frequent screening and Pap smears.   The above recommendations are the same for women who have or have not gotten the vaccine for HPV (Human Papillomavirus).   If you had a hysterectomy for a problem that was not a cancer or a condition that could lead to cancer, then you no longer need Pap smears.   If you are between ages 11 and 43, and you have had normal Pap smears going back 10 years, you no longer need Pap smears.   If you have had past treatment for cervical cancer or a condition that could lead to cancer, you need Pap smears and screening for cancer for at least 20 years after your treatment.   Continue monthly self-breast examinations. Your caregiver can provide information and instructions for self-breast examination.  Document Released: 12/01/2000 Document Re-Released: 05/24/2010 [facility] Patient Information 2011 [place].DISCHARGE INSTRUCTIONS: D&C / D&E The following instructions have been prepared to help you care for yourself upon your return home.   Personal hygiene:  Use sanitary pads for vaginal drainage, not tampons.  Shower the day after your procedure.  NO tub baths, pools or Jacuzzis for 2-3 weeks.  Wipe front to back after using the bathroom.  Activity and limitations:  Do NOT drive or operate any equipment for 24 hours. The effects of anesthesia are still present and drowsiness may result.  Do NOT rest in bed all day.  Walking is encouraged.  Walk up and down stairs slowly.  You may resume your normal activity in one to two days or as indicated by your physician.  Sexual activity: NO intercourse for at least 2 weeks after the procedure, or as indicated by your physician.  Diet: Eat a light meal as desired this evening. You may resume your usual diet tomorrow.  Return to work: You may resume your work activities in one to two days or as indicated by your doctor.  What to expect after your  surgery: Expect to have vaginal bleeding/discharge for 2-3 days and spotting for up to 10 days. It is not unusual to have soreness for up to 1-2 weeks. You may have a slight burning sensation when you urinate for the first day. Mild cramps may continue for a couple of days. You may have a regular period in 2-6 weeks.  Call your doctor for any of the following:  Excessive vaginal bleeding, saturating and changing one pad every hour.  Inability to urinate 6 hours after discharge from hospital.  Pain not relieved by pain medication.  Fever of 100.4 F or greater.  Unusual vaginal discharge or odor.   Call for an appointment:    Patients signature: ______________________  Nurses signature ________________________  Support person's signature_______________________

## 2014-11-25 NOTE — H&P (Signed)
  Chief Complaint: 33 y.o.  who presents with AUB  Details of Present Illness: The patient has a Mirena IUD insitu.  She has had prolonged bleeding episodes refractory to medical management.  BP 134/82 mmHg  Pulse 89  Temp(Src) 98.4 F (36.9 C) (Oral)  Resp 20  SpO2 97%  LMP 10/21/2014  Breastfeeding? No  Past Medical History  Diagnosis Date  . Pregnancy induced hypertension   . Fibroid   . Migraines    History   Social History  . Marital Status: Married    Spouse Name: N/A    Number of Children: N/A  . Years of Education: N/A   Occupational History  . Not on file.   Social History Main Topics  . Smoking status: Current Some Day Smoker -- 0.25 packs/day    Types: Cigarettes    Last Attempt to Quit: 02/13/2014  . Smokeless tobacco: Not on file     Comment: trying to quit  . Alcohol Use: No  . Drug Use: No  . Sexual Activity:    Partners: Male    Birth Control/ Protection: IUD   Other Topics Concern  . Not on file   Social History Narrative   Family History  Problem Relation Age of Onset  . Hypertension Mother   . Hypertension Father   . Hypertension Maternal Grandmother   . Cancer Maternal Grandmother   . Hypertension Maternal Grandfather   . Hypertension Paternal Grandmother   . Hypertension Paternal Grandfather   . Cancer Paternal Grandfather     Pertinent items are noted in HPI.  Pre-Op Diagnosis: Prolonged bleeding episode   Planned Procedure: Procedure(s): DILATATION AND CURETTAGE  I have reviewed the patient's history and have completed the physical exam and Annette Miranda is acceptable for surgery.  Annette Lawrence, MD 11/25/2014 11:22 AM

## 2014-11-25 NOTE — Anesthesia Procedure Notes (Signed)
Procedure Name: MAC Date/Time: 11/25/2014 11:28 AM Performed by: Brock Ra Pre-anesthesia Checklist: Patient identified, Emergency Drugs available, Suction available, Patient being monitored and Timeout performed Patient Re-evaluated:Patient Re-evaluated prior to inductionOxygen Delivery Method: Nasal cannula

## 2014-11-25 NOTE — Transfer of Care (Signed)
Immediate Anesthesia Transfer of Care Note  Patient: Visual merchandiser  Procedure(s) Performed: Procedure(s): DILATATION AND CURETTAGE (N/A)  Patient Location: PACU  Anesthesia Type:General  Level of Consciousness: awake, alert , oriented and patient cooperative  Airway & Oxygen Therapy: Patient Spontanous Breathing and Patient connected to nasal cannula oxygen  Post-op Assessment: Report given to PACU RN and Post -op Vital signs reviewed and stable  Post vital signs: Reviewed and stable  Complications: No apparent anesthesia complications

## 2014-11-26 ENCOUNTER — Encounter (HOSPITAL_COMMUNITY): Payer: Self-pay | Admitting: Obstetrics & Gynecology

## 2014-12-02 ENCOUNTER — Encounter (HOSPITAL_COMMUNITY): Payer: Self-pay | Admitting: *Deleted

## 2014-12-02 ENCOUNTER — Observation Stay (HOSPITAL_COMMUNITY)
Admission: AD | Admit: 2014-12-02 | Discharge: 2014-12-03 | Disposition: A | Discharge status: Home / Self Care | Payer: No Typology Code available for payment source | Source: Ambulatory Visit | Attending: Obstetrics & Gynecology | Admitting: Obstetrics & Gynecology

## 2014-12-02 ENCOUNTER — Ambulatory Visit (INDEPENDENT_AMBULATORY_CARE_PROVIDER_SITE_OTHER): Payer: PRIVATE HEALTH INSURANCE | Admitting: Obstetrics & Gynecology

## 2014-12-02 ENCOUNTER — Encounter: Payer: Self-pay | Admitting: Obstetrics & Gynecology

## 2014-12-02 VITALS — Temp 99.5°F | Ht 69.0 in | Wt 264.0 lb

## 2014-12-02 DIAGNOSIS — Z7951 Long term (current) use of inhaled steroids: Secondary | ICD-10-CM | POA: Diagnosis not present

## 2014-12-02 DIAGNOSIS — Z720 Tobacco use: Secondary | ICD-10-CM | POA: Insufficient documentation

## 2014-12-02 DIAGNOSIS — Z79899 Other long term (current) drug therapy: Secondary | ICD-10-CM | POA: Insufficient documentation

## 2014-12-02 DIAGNOSIS — N939 Abnormal uterine and vaginal bleeding, unspecified: Secondary | ICD-10-CM | POA: Diagnosis present

## 2014-12-02 DIAGNOSIS — N938 Other specified abnormal uterine and vaginal bleeding: Secondary | ICD-10-CM

## 2014-12-02 DIAGNOSIS — G43909 Migraine, unspecified, not intractable, without status migrainosus: Secondary | ICD-10-CM | POA: Diagnosis not present

## 2014-12-02 LAB — CBC
HCT: 32.5 % — ABNORMAL LOW (ref 36.0–46.0)
Hemoglobin: 10.8 g/dL — ABNORMAL LOW (ref 12.0–15.0)
MCH: 29 pg (ref 26.0–34.0)
MCHC: 33.2 g/dL (ref 30.0–36.0)
MCV: 87.1 fL (ref 78.0–100.0)
PLATELETS: 311 10*3/uL (ref 150–400)
RBC: 3.73 MIL/uL — AB (ref 3.87–5.11)
RDW: 13.2 % (ref 11.5–15.5)
WBC: 7.9 10*3/uL (ref 4.0–10.5)

## 2014-12-02 LAB — TYPE AND SCREEN
ABO/RH(D): A POS
Antibody Screen: NEGATIVE

## 2014-12-02 MED ORDER — ONDANSETRON HCL 4 MG PO TABS: 4.0000 mg | ORAL_TABLET | Freq: Four times a day (QID) | ORAL | Status: DC | PRN

## 2014-12-02 MED ORDER — SIMETHICONE 80 MG PO CHEW: 80.0000 mg | CHEWABLE_TABLET | Freq: Four times a day (QID) | ORAL | Status: DC | PRN

## 2014-12-02 MED ORDER — ESTROGENS CONJUGATED 25 MG IJ SOLR
25.0000 mg | Freq: Once | INTRAMUSCULAR | Status: AC
  Administered 2014-12-02: 25 mg via INTRAVENOUS
  Filled 2014-12-02: qty 25

## 2014-12-02 MED ORDER — SODIUM CHLORIDE 0.45 % IV SOLN
INTRAVENOUS | Status: DC
  Administered 2014-12-02: 17:00:00 via INTRAVENOUS

## 2014-12-02 MED ORDER — IBUPROFEN 600 MG PO TABS
600.0000 mg | ORAL_TABLET | Freq: Four times a day (QID) | ORAL | Status: DC | PRN
  Administered 2014-12-02: 600 mg via ORAL
  Filled 2014-12-02: qty 1

## 2014-12-02 MED ORDER — OXYCODONE-ACETAMINOPHEN 5-325 MG PO TABS: 1.0000 | ORAL_TABLET | ORAL | Status: DC | PRN

## 2014-12-02 MED ORDER — ONDANSETRON HCL 4 MG/2ML IJ SOLN: 4.0000 mg | Freq: Four times a day (QID) | INTRAMUSCULAR | Status: DC | PRN

## 2014-12-02 MED ORDER — ESTROGENS CONJUGATED 1.25 MG PO TABS
2.5000 mg | ORAL_TABLET | Freq: Four times a day (QID) | ORAL | Status: AC
  Administered 2014-12-02 – 2014-12-03 (×3): 2.5 mg via ORAL
  Filled 2014-12-02 (×3): qty 2

## 2014-12-02 NOTE — H&P (Signed)
Annette Miranda is an 33 y.o. female. She presents with a heavy, prolonged bleeding episode.  She is s/p a recent Mirena IUD insertion for contraception.  She experienced a prolonged, heavy bleed refractory to high dose Provera followed by Lysteda.  She experienced a decrease in the flow after a therapeutic/diagnostic D&C with IUD reinsertion.  The bleeding has become heavier over the last several days  Pertinent Gynecological History: Menses: flow is moderate Contraception: IUD Previous GYN Procedures: see above  Last pap: normal Date: 10/15    Menstrual History:  No LMP recorded.    Past Medical History  Diagnosis Date  . Pregnancy induced hypertension   . Fibroid   . Migraines     Past Surgical History  Procedure Laterality Date  . Cholecystectomy    . Dilation and curettage of uterus N/A 11/25/2014    Procedure: DILATATION AND CURETTAGE;  Surgeon: Lahoma Crocker, MD;  Location: [facility];  Service: Gynecology;  Laterality: N/A;    Family History  Problem Relation Age of Onset  . Hypertension Mother   . Hypertension Father   . Hypertension Maternal Grandmother   . Cancer Maternal Grandmother   . Hypertension Maternal Grandfather   . Hypertension Paternal Grandmother   . Hypertension Paternal Grandfather   . Cancer Paternal Grandfather     Social History:  reports that she has been smoking Cigarettes.  She has been smoking about 0.25 packs per day. She does not have any smokeless tobacco history on file. She reports that she does not drink alcohol or use illicit drugs.  Allergies: No Known Allergies  Prescriptions prior to admission  Medication Sig Dispense Refill Last Dose  . fluticasone (FLONASE) 50 MCG/ACT nasal spray Place into both nostrils daily.   12/02/2014 at 0800  . SUMAtriptan (IMITREX) 50 MG tablet Take 1 tablet (50 mg total) by mouth every 2 (two) hours as needed for migraine or headache. May repeat in 2 hours if headache persists or recurs. 10 tablet 2  Past Week at Unknown time  . topiramate (TOPAMAX) 50 MG tablet Take 25 mg daily for 7 days. Then increase to 50 mg daily. (Patient taking differently: Take 50 mg by mouth daily. ) 30 tablet 2 Past Week at Unknown time    Review of Systems  Constitutional: Negative for fever.  Eyes: Negative for blurred vision.  Respiratory: Negative for shortness of breath.   Gastrointestinal: Negative for nausea and vomiting.  Skin: Negative for rash.  Neurological: Negative for headaches.    Blood pressure 129/81, pulse 80, temperature 98.3 F (36.8 C), temperature source Oral, resp. rate 20, height 5\' 9"  (1.753 m), weight 119.75 kg (264 lb), SpO2 100 %, not currently breastfeeding. Physical Exam  Constitutional: She appears well-developed.  HENT:  Head: Normocephalic.  Neck: Neck supple. No thyromegaly present.  Cardiovascular: Normal rate and regular rhythm.   Respiratory: Breath sounds normal.  GI: Soft. Bowel sounds are normal.  Genitourinary:  Moderate heme in the vaginal vault  Skin: No rash noted.    Results for orders placed or performed during the hospital encounter of 12/02/14 (from the past 24 hour(s))  CBC     Status: Abnormal   Collection Time: 12/02/14  5:38 PM  Result Value Ref Range   WBC 7.9 4.0 - 10.5 K/uL   RBC 3.73 (L) 3.87 - 5.11 MIL/uL   Hemoglobin 10.8 (L) 12.0 - 15.0 g/dL   HCT 32.5 (L) 36.0 - 46.0 %   MCV 87.1 78.0 - 100.0 fL  MCH 29.0 26.0 - 34.0 pg   MCHC 33.2 30.0 - 36.0 g/dL   RDW 13.2 11.5 - 15.5 %   Platelets 311 150 - 400 K/uL  Type and screen     Status: None   Collection Time: 12/02/14  6:29 PM  Result Value Ref Range   ABO/RH(D) A POS    Antibody Screen NEG    Sample Expiration 12/05/2014     No results found.  Assessment/Plan: Prolonged bleeding episode, refractory to medical management Mirena IUD in situ Hemodynamically stable; Hgb > 10  Observation; high dose Premarin-->COCP cascade vs po Premarin x 2 - 3 weeks  [name],Margit Batte  A 12/02/2014, 11:48 PM

## 2014-12-03 ENCOUNTER — Other Ambulatory Visit: Payer: Self-pay | Admitting: *Deleted

## 2014-12-03 DIAGNOSIS — N939 Abnormal uterine and vaginal bleeding, unspecified: Secondary | ICD-10-CM | POA: Diagnosis not present

## 2014-12-03 MED ORDER — NORETHINDRONE-ETH ESTRADIOL 0.4-35 MG-MCG PO TABS: 1.0000 | ORAL_TABLET | Freq: Every day | ORAL | Status: DC

## 2014-12-03 MED ORDER — PROMETHAZINE HCL 12.5 MG PO TABS: 12.5000 mg | ORAL_TABLET | Freq: Four times a day (QID) | ORAL | Status: DC | PRN

## 2014-12-03 NOTE — Progress Notes (Signed)
Denies any vaginal bleeding since 7 pm

## 2014-12-03 NOTE — Progress Notes (Signed)
Subjective: Patient reports less vaginal bleeding.    Objective: I have reviewed patient's vital signs, intake and output, medications and labs.  General: alert and no distress Resp: clear to auscultation bilaterally Cardio: regular rate and rhythm, S1, S2 normal, no murmur, click, rub or gallop GI: normal findings: soft, non-tender Extremities: extremities normal, atraumatic, no cyanosis or edema Vaginal Bleeding: minimal   Assessment/Plan: AUB.  Stable.  Continue po Premarin.   LOS: 1 day    Clare Fennimore A 12/03/2014, 9:01 AM

## 2014-12-04 ENCOUNTER — Encounter (HOSPITAL_COMMUNITY): Payer: Self-pay | Admitting: General Practice

## 2014-12-04 NOTE — Discharge Summary (Signed)
  Physician Discharge Summary  Patient ID: Annette Miranda MRN: [id number] DOB/AGE: 1981/12/16 33 y.o.  Admit date: 12/02/2014 Discharge date: 12/04/2014  Admission Diagnoses: Active Problems:   Abnormal uterine bleeding   Discharge Diagnoses:  Active Problems:   Abnormal uterine bleeding   Discharged Condition: good  Hospital Course: The hemoglobin on admission was > 10 mg/dl.  The bleeding episode was arrested with high dose premarin.  The patient reported minimal bleeding at the time of discharge.  Consults: None  Significant Diagnostic Studies: labs: CBC  Treatments: IV hydration, see above  Discharge Exam: Blood pressure 126/83, pulse 85, temperature 98.2 F (36.8 C), temperature source Oral, resp. rate 20, height 5\' 9"  (1.753 m), weight 119.75 kg (264 lb), SpO2 99 %, not currently breastfeeding. General appearance: alert Resp: clear to auscultation bilaterally Cardio: regular rate and rhythm, S1, S2 normal, no murmur, click, rub or gallop GI: soft, non-tender; bowel sounds normal; no masses,  no organomegaly Extremities: extremities normal, atraumatic, no cyanosis or edema PV loss: scant heme  Disposition: 01-Home or Self Care  Discharge Instructions    Activity as tolerated - No restrictions    Complete by:  As directed      Call MD for:  extreme fatigue    Complete by:  As directed      Call MD for:  persistant dizziness or light-headedness    Complete by:  As directed      Call MD for:  persistant nausea and vomiting    Complete by:  As directed      Call MD for:  severe uncontrolled pain    Complete by:  As directed      Call MD for:  temperature >100.4    Complete by:  As directed      Diet - low sodium heart healthy    Complete by:  As directed             Medication List    TAKE these medications        fluticasone 50 MCG/ACT nasal spray  Commonly known as:  FLONASE  Place into both nostrils daily.     norethindrone-ethinyl estradiol 0.4-35  MG-MCG tablet  Commonly known as:  OVCON-35,BALZIVA,BRIELLYN  Take 1 tablet by mouth daily. 1 tab 3 times /day on day 1, 1 tab 2 times /day on day 2, 1 tab daily x 2 weeks     promethazine 12.5 MG tablet  Commonly known as:  PHENERGAN  Take 1 tablet (12.5 mg total) by mouth every 6 (six) hours as needed for nausea or vomiting.     SUMAtriptan 50 MG tablet  Commonly known as:  IMITREX  Take 1 tablet (50 mg total) by mouth every 2 (two) hours as needed for migraine or headache. May repeat in 2 hours if headache persists or recurs.     topiramate 50 MG tablet  Commonly known as:  TOPAMAX  Take 25 mg daily for 7 days. Then increase to 50 mg daily.         SignedLahoma Crocker A 12/04/2014, 9:10 PM

## 2014-12-06 NOTE — Progress Notes (Signed)
Patient ID: Annette Miranda, female   DOB: [birth year], 33 y.o.   MRN: [id number]  Chief Complaint  Patient presents with  . Follow-up    HPI Annette Miranda is a 33 y.o. female.  She has persistent, heavy vaginal bleeding. HPI  Past Medical History  Diagnosis Date  . Pregnancy induced hypertension   . Fibroid   . Migraines     Past Surgical History  Procedure Laterality Date  . Cholecystectomy    . Dilation and curettage of uterus N/A 11/25/2014    Procedure: DILATATION AND CURETTAGE;  Surgeon: Lahoma Crocker, MD;  Location: [facility];  Service: Gynecology;  Laterality: N/A;  . Wisdom tooth extraction N/A     Family History  Problem Relation Age of Onset  . Hypertension Mother   . Hypertension Father   . Hypertension Maternal Grandmother   . Cancer Maternal Grandmother   . Hypertension Maternal Grandfather   . Hypertension Paternal Grandmother   . Hypertension Paternal Grandfather   . Cancer Paternal Grandfather     Social History History  Substance Use Topics  . Smoking status: Current Some Day Smoker -- 0.25 packs/day for 18 years    Types: Cigarettes  . Smokeless tobacco: Never Used     Comment: trying to quit  . Alcohol Use: No    No Known Allergies  Current Outpatient Prescriptions  Medication Sig Dispense Refill  . fluticasone (FLONASE) 50 MCG/ACT nasal spray Place into both nostrils daily.    . SUMAtriptan (IMITREX) 50 MG tablet Take 1 tablet (50 mg total) by mouth every 2 (two) hours as needed for migraine or headache. May repeat in 2 hours if headache persists or recurs. 10 tablet 2  . topiramate (TOPAMAX) 50 MG tablet Take 25 mg daily for 7 days. Then increase to 50 mg daily. (Patient taking differently: Take 50 mg by mouth daily. ) 30 tablet 2  . norethindrone-ethinyl estradiol (OVCON-35,BALZIVA,BRIELLYN) 0.4-35 MG-MCG tablet Take 1 tablet by mouth daily. 1 tab 3 times /day on day 1, 1 tab 2 times /day on day 2, 1 tab daily x 2 weeks 2 Package 11  .  promethazine (PHENERGAN) 12.5 MG tablet Take 1 tablet (12.5 mg total) by mouth every 6 (six) hours as needed for nausea or vomiting. 30 tablet 0   No current facility-administered medications for this visit.    Review of Systems Review of Systems Constitutional: negative for fatigue and weight loss Respiratory: negative for cough and wheezing Cardiovascular: positive for lightheadedness Gastrointestinal: negative for abdominal pain and change in bowel habits Genitourinary: positive for abnormal bleeding Integument/breast: negative for nipple discharge Musculoskeletal:negative for myalgias Neurological: negative for gait problems and tremors Behavioral/Psych: negative for abusive relationship, depression Endocrine: negative for temperature intolerance     Temperature 99.5 F (37.5 C), height 5\' 9"  (1.753 m), weight 119.75 kg (264 lb), not currently breastfeeding.  Physical Exam Physical Exam General:   alert  Skin:   no rash or abnormalities  Lungs:   clear to auscultation bilaterally  Heart:   regular rate and rhythm, S1, S2 normal, no murmur, click, rub or gallop  Abdomen:  normal findings: no organomegaly, soft, non-tender and no hernia  Pelvis:  External genitalia: normal general appearance Urinary system: urethral meatus normal and bladder without fullness, nontender Vaginal: moderate heme in the vaginal vault Cervix: normal appearance Adnexa: normal bimanual exam Uterus: anteverted and non-tender, normal size      Data Reviewed None  Assessment    Prolonged episode of bleeding--refractory to  D&C, medical management Orthostatic by vital signs    Plan    Admit to [facility] for high dose Premarin, IV hydration Follow up as needed.         [name],Rachella Basden A 12/06/2014, 11:56 AM

## 2014-12-14 ENCOUNTER — Encounter: Payer: Self-pay | Admitting: *Deleted

## 2014-12-15 ENCOUNTER — Encounter: Payer: Self-pay | Admitting: Obstetrics & Gynecology

## 2014-12-17 ENCOUNTER — Ambulatory Visit (HOSPITAL_COMMUNITY): Payer: No Typology Code available for payment source | Admitting: Anesthesiology

## 2014-12-17 ENCOUNTER — Encounter (HOSPITAL_COMMUNITY): Payer: Self-pay | Admitting: Anesthesiology

## 2014-12-17 ENCOUNTER — Ambulatory Visit (HOSPITAL_COMMUNITY)
Admission: RE | Admit: 2014-12-17 | Discharge: 2014-12-17 | Disposition: A | Discharge status: Home / Self Care | Payer: No Typology Code available for payment source | Source: Ambulatory Visit | Attending: Obstetrics & Gynecology | Admitting: Obstetrics & Gynecology

## 2014-12-17 ENCOUNTER — Encounter (HOSPITAL_COMMUNITY)
Admission: RE | Disposition: A | Discharge status: Home / Self Care | Payer: Self-pay | Source: Ambulatory Visit | Attending: Obstetrics & Gynecology

## 2014-12-17 DIAGNOSIS — I10 Essential (primary) hypertension: Secondary | ICD-10-CM | POA: Diagnosis not present

## 2014-12-17 DIAGNOSIS — G43909 Migraine, unspecified, not intractable, without status migrainosus: Secondary | ICD-10-CM | POA: Insufficient documentation

## 2014-12-17 DIAGNOSIS — Z30432 Encounter for removal of intrauterine contraceptive device: Secondary | ICD-10-CM

## 2014-12-17 DIAGNOSIS — Z30430 Encounter for insertion of intrauterine contraceptive device: Secondary | ICD-10-CM | POA: Insufficient documentation

## 2014-12-17 DIAGNOSIS — N939 Abnormal uterine and vaginal bleeding, unspecified: Secondary | ICD-10-CM | POA: Insufficient documentation

## 2014-12-17 DIAGNOSIS — N926 Irregular menstruation, unspecified: Secondary | ICD-10-CM

## 2014-12-17 DIAGNOSIS — F17210 Nicotine dependence, cigarettes, uncomplicated: Secondary | ICD-10-CM | POA: Insufficient documentation

## 2014-12-17 DIAGNOSIS — K219 Gastro-esophageal reflux disease without esophagitis: Secondary | ICD-10-CM | POA: Insufficient documentation

## 2014-12-17 DIAGNOSIS — Z8249 Family history of ischemic heart disease and other diseases of the circulatory system: Secondary | ICD-10-CM | POA: Insufficient documentation

## 2014-12-17 DIAGNOSIS — D259 Leiomyoma of uterus, unspecified: Secondary | ICD-10-CM

## 2014-12-17 DIAGNOSIS — Z6837 Body mass index (BMI) 37.0-37.9, adult: Secondary | ICD-10-CM | POA: Insufficient documentation

## 2014-12-17 HISTORY — PX: DILITATION & CURRETTAGE/HYSTROSCOPY WITH NOVASURE ABLATION: SHX5568

## 2014-12-17 HISTORY — PX: IUD REMOVAL: SHX5392

## 2014-12-17 HISTORY — PX: INTRAUTERINE DEVICE (IUD) INSERTION: SHX5877

## 2014-12-17 LAB — CBC
HEMATOCRIT: 34.8 % — AB (ref 36.0–46.0)
Hemoglobin: 11.1 g/dL — ABNORMAL LOW (ref 12.0–15.0)
MCH: 26.8 pg (ref 26.0–34.0)
MCHC: 31.9 g/dL (ref 30.0–36.0)
MCV: 84.1 fL (ref 78.0–100.0)
Platelets: 220 10*3/uL (ref 150–400)
RBC: 4.14 MIL/uL (ref 3.87–5.11)
RDW: 13.5 % (ref 11.5–15.5)
WBC: 5.5 10*3/uL (ref 4.0–10.5)

## 2014-12-17 LAB — PREGNANCY, URINE: Preg Test, Ur: NEGATIVE

## 2014-12-17 SURGERY — DILATATION & CURETTAGE/HYSTEROSCOPY WITH NOVASURE ABLATION
Anesthesia: General | Site: Uterus

## 2014-12-17 MED ORDER — DEXAMETHASONE SODIUM PHOSPHATE 10 MG/ML IJ SOLN
INTRAMUSCULAR | Status: DC | PRN
  Administered 2014-12-17: 4 mg via INTRAVENOUS

## 2014-12-17 MED ORDER — PROPOFOL 10 MG/ML IV EMUL
INTRAVENOUS | Status: AC
  Filled 2014-12-17: qty 20

## 2014-12-17 MED ORDER — LIDOCAINE HCL (CARDIAC) 20 MG/ML IV SOLN
INTRAVENOUS | Status: DC | PRN
  Administered 2014-12-17: 70 mg via INTRAVENOUS
  Administered 2014-12-17: 30 mg via INTRAVENOUS

## 2014-12-17 MED ORDER — MORPHINE SULFATE 4 MG/ML IJ SOLN: 1.0000 mg | INTRAMUSCULAR | Status: DC | PRN

## 2014-12-17 MED ORDER — MEPERIDINE HCL 25 MG/ML IJ SOLN: 6.2500 mg | INTRAMUSCULAR | Status: DC | PRN

## 2014-12-17 MED ORDER — FENTANYL CITRATE 0.05 MG/ML IJ SOLN
INTRAMUSCULAR | Status: AC
  Filled 2014-12-17: qty 2

## 2014-12-17 MED ORDER — ONDANSETRON HCL 4 MG/2ML IJ SOLN
INTRAMUSCULAR | Status: AC
  Filled 2014-12-17: qty 2

## 2014-12-17 MED ORDER — LIDOCAINE HCL 1 % IJ SOLN
INTRAMUSCULAR | Status: DC | PRN
  Administered 2014-12-17: 10 mL

## 2014-12-17 MED ORDER — FENTANYL CITRATE 0.05 MG/ML IJ SOLN
INTRAMUSCULAR | Status: DC | PRN
  Administered 2014-12-17 (×4): 50 ug via INTRAVENOUS

## 2014-12-17 MED ORDER — OXYCODONE-ACETAMINOPHEN 5-325 MG PO TABS
1.0000 | ORAL_TABLET | Freq: Once | ORAL | Status: AC
  Administered 2014-12-17: 1 via ORAL

## 2014-12-17 MED ORDER — MIDAZOLAM HCL 2 MG/2ML IJ SOLN
INTRAMUSCULAR | Status: DC | PRN
  Administered 2014-12-17: 2 mg via INTRAVENOUS

## 2014-12-17 MED ORDER — SCOPOLAMINE 1 MG/3DAYS TD PT72
MEDICATED_PATCH | TRANSDERMAL | Status: AC
  Filled 2014-12-17: qty 1

## 2014-12-17 MED ORDER — DEXAMETHASONE SODIUM PHOSPHATE 4 MG/ML IJ SOLN
INTRAMUSCULAR | Status: AC
  Filled 2014-12-17: qty 1

## 2014-12-17 MED ORDER — MIDAZOLAM HCL 2 MG/2ML IJ SOLN
INTRAMUSCULAR | Status: AC
  Filled 2014-12-17: qty 2

## 2014-12-17 MED ORDER — KETOROLAC TROMETHAMINE 30 MG/ML IJ SOLN
INTRAMUSCULAR | Status: DC | PRN
  Administered 2014-12-17: 30 mg via INTRAVENOUS

## 2014-12-17 MED ORDER — OXYCODONE-ACETAMINOPHEN 5-325 MG PO TABS
ORAL_TABLET | ORAL | Status: DC
Start: 2014-12-17 — End: 2014-12-17
  Filled 2014-12-17: qty 1

## 2014-12-17 MED ORDER — PROPOFOL 10 MG/ML IV BOLUS
INTRAVENOUS | Status: DC | PRN
  Administered 2014-12-17: 180 mg via INTRAVENOUS

## 2014-12-17 MED ORDER — OXYCODONE-ACETAMINOPHEN 5-325 MG PO TABS: 2.0000 | ORAL_TABLET | Freq: Four times a day (QID) | ORAL | Status: DC | PRN

## 2014-12-17 MED ORDER — KETOROLAC TROMETHAMINE 30 MG/ML IJ SOLN
INTRAMUSCULAR | Status: AC
  Filled 2014-12-17: qty 1

## 2014-12-17 MED ORDER — PHENYLEPHRINE HCL 10 MG/ML IJ SOLN
INTRAMUSCULAR | Status: DC | PRN
  Administered 2014-12-17 (×2): 80 ug via INTRAVENOUS

## 2014-12-17 MED ORDER — LACTATED RINGERS IV SOLN
INTRAVENOUS | Status: DC
  Administered 2014-12-17 (×2): via INTRAVENOUS

## 2014-12-17 MED ORDER — LACTATED RINGERS IV SOLN
INTRAVENOUS | Status: DC | PRN
Start: 2014-12-17 — End: 2014-12-17
  Administered 2014-12-17: 3000 mL

## 2014-12-17 MED ORDER — SCOPOLAMINE 1 MG/3DAYS TD PT72
1.0000 | MEDICATED_PATCH | Freq: Once | TRANSDERMAL | Status: DC
  Administered 2014-12-17: 1.5 mg via TRANSDERMAL

## 2014-12-17 MED ORDER — PHENYLEPHRINE 40 MCG/ML (10ML) SYRINGE FOR IV PUSH (FOR BLOOD PRESSURE SUPPORT)
PREFILLED_SYRINGE | INTRAVENOUS | Status: AC
  Filled 2014-12-17: qty 5

## 2014-12-17 MED ORDER — LIDOCAINE HCL 1 % IJ SOLN
INTRAMUSCULAR | Status: AC
  Filled 2014-12-17: qty 20

## 2014-12-17 MED ORDER — ONDANSETRON HCL 4 MG/2ML IJ SOLN
INTRAMUSCULAR | Status: DC | PRN
  Administered 2014-12-17: 4 mg via INTRAVENOUS

## 2014-12-17 MED ORDER — METOCLOPRAMIDE HCL 5 MG/ML IJ SOLN: 10.0000 mg | Freq: Once | INTRAMUSCULAR | Status: DC | PRN

## 2014-12-17 MED ORDER — LIDOCAINE HCL (CARDIAC) 20 MG/ML IV SOLN
INTRAVENOUS | Status: AC
  Filled 2014-12-17: qty 5

## 2014-12-17 SURGICAL SUPPLY — 16 items
ABLATOR ENDOMETRIAL BIPOLAR (ABLATOR) ×2 IMPLANT
CANISTER SUCT 3000ML (MISCELLANEOUS) ×2 IMPLANT
CATH ROBINSON RED A/P 16FR (CATHETERS) ×2 IMPLANT
CLOTH BEACON ORANGE TIMEOUT ST (SAFETY) ×2 IMPLANT
CONTAINER PREFILL 10% NBF 60ML (FORM) ×2 IMPLANT
GLOVE BIO SURGEON STRL SZ 6.5 (GLOVE) ×2 IMPLANT
GLOVE SURG SS PI 7.0 STRL IVOR (GLOVE) ×12 IMPLANT
GOWN STRL REUS W/TWL LRG LVL3 (GOWN DISPOSABLE) ×4 IMPLANT
NEEDLE SPNL 20GX3.5 QUINCKE YW (NEEDLE) IMPLANT
PACK VAGINAL MINOR WOMEN LF (CUSTOM PROCEDURE TRAY) ×2 IMPLANT
PAD OB MATERNITY 4.3X12.25 (PERSONAL CARE ITEMS) ×2 IMPLANT
SCRUB PCMX 4 OZ (MISCELLANEOUS) ×2 IMPLANT
TOWEL OR 17X24 6PK STRL BLUE (TOWEL DISPOSABLE) ×4 IMPLANT
TUBING AQUILEX INFLOW (TUBING) ×2 IMPLANT
TUBING AQUILEX OUTFLOW (TUBING) ×2 IMPLANT
WATER STERILE IRR 1000ML POUR (IV SOLUTION) ×2 IMPLANT

## 2014-12-17 NOTE — Discharge Instructions (Signed)
D&C/Endometrial Ablation Care After Read the instructions below. Refer to this sheet in the next few weeks. These instructions provide you with general information on caring for yourself after you leave the hospital. Your caregiver may also give you specific instructions.  A D&C/endometrial ablation  is a minor operation. A D&C involves the stretching (dilatation) of the cervix and scraping (curettage) of the inside lining of the uterus. Endometrial ablation (EA) is a surgery that makes a womans period much lighter or stops it completely.  You may have light cramping and bleeding for a couple of days to two weeks after the procedure. This procedure may be done in a hospital, outpatient clinic, or doctor's office. You may be given a drug to make you sleep (general anesthetic) or a drug that numbs the area (local anesthetic) in and around the cervix. HOME CARE INSTRUCTIONS  Do not drive for 24 hours.   Wait one week before returning to strenuous activities.   Take your temperature two times a day for 4 days and write it down. Provide these temperatures to your caregiver if they are abnormal (above 98.6 F or 37.0 C).   Avoid long periods of standing, and do no heavy lifting (more than 10 pounds), pushing or pulling.   Limit stair climbing to once or twice a day.   Take rest periods often.   You may resume your usual diet.   Drink plenty of fluids (6-8 glasses a day).   You should return to your usual bowel function. If constipation should occur, you may:   Take a mild laxative with permission from your caregiver.   Add fruit and bran to your diet.   Drink more fluids. This helps with constipation.   Take showers instead of baths until your caregiver gives you permission to take baths.   Do not go swimming or use a hot tub until your caregiver gives you permission.   Try to have someone with you or available for you the first 24 to 48 hours, especially if you had a general  anesthetic.   Do not douche, use tampons, or have intercourse until after your follow-up appointment, or when your caregiver approves.   Only take over-the-counter or prescription medicines for pain, discomfort, or fever as directed by your caregiver. Do not take aspirin. It can cause bleeding.   If a prescription was given, follow your caregiver's directions. You may be given a medicine that kills germs (antibiotic) to prevent an infection.   Keep all your follow-up appointments recommended by your caregiver.  SEEK MEDICAL CARE IF:  You have increasing cramps or pain not relieved with medication.   You develop belly (abdominal) pain which does not seem to be related to the same area of earlier cramping and pain.   You feel dizzy or feel like fainting.   You have bad smelling vaginal discharge.   You develop a rash.   You develop a reaction or allergy to your medication.  SEEK IMMEDIATE MEDICAL CARE IF:  Bleeding is heavier than a normal menstrual period.   You have an oral temperature above 100.6, not controlled by medicine.   You develop chest pain.   You develop shortness of breath.   You pass out.   You develop pain in your shoulder strap area.   You develop heavy vaginal bleeding with or without blood clots.  MAKE SURE YOU:   Understand these instructions.   Will watch your condition.   Will get help right away if  you are not doing well or get worse.  Document Released: 12/01/2000 Document Re-Released: 05/24/2010 [facility] Patient Information 2011 [place].    Post Anesthesia Home Care Instructions  Activity: Get plenty of rest for the remainder of the day. A responsible adult should stay with you for 24 hours following the procedure.  For the next 24 hours, DO NOT: -Drive a car -Paediatric nurse -Drink alcoholic beverages -Take any medication unless instructed by your physician -Make any legal decisions or sign important papers.  Meals: Start  with liquid foods such as gelatin or soup. Progress to regular foods as tolerated. Avoid greasy, spicy, heavy foods. If nausea and/or vomiting occur, drink only clear liquids until the nausea and/or vomiting subsides. Call your physician if vomiting continues.  Special Instructions/Symptoms: Your throat may feel dry or sore from the anesthesia or the breathing tube placed in your throat during surgery. If this causes discomfort, gargle with warm salt water. The discomfort should disappear within 24 hours.

## 2014-12-17 NOTE — Op Note (Signed)
Preoperative diagnosis: Abnormal uterine bleeding  Postoperative diagnosis: Same  Procedure: Diagnostic hysteroscopy, dilatation and curettage, Novasure endometrial ablation, Mirena IUD removal/insertion   Surgeon: Lahoma Crocker A  Anesthesia: Laryngeal mask airway, paracervical block  Estimated blood loss: Minimal  Urine output: per Anesthesiology  IV Fluids: per Anesthesiology  Complications: None  Specimen: PATHOLOGY  Operative Findings: The IUD hub was at the external os.  At hysteroscopy prior to the ablation, the endometrium appear lush.  Visualization was limited/obscurred.   Description of procedure:   The patient was taken to the operating room and placed on the operating table in the semi-lithotomy position in [place].  Examination under anesthesia was performed.  The patient was prepped and draped in the usual manner.  After a time-out had been completed, a speculum was placed in the vagina.  The anterior lip of the cervix was grasped with a single-toothed tenaculum.  The IUD was grasped with a uterine dressing forceps and removed.  The endocervical canal sounded to 3.5 cm.  The uterine cavity sounded to 10 cm.  The endocervical canal was dilated with Kennon Rounds dilators.  A 5 mm diagnostic hysteroscope with Glycine as the distending medium was used to perform a diagnostic hysteroscopy.  The hysteroscope was removed.  A small, Sims curette was used to perform an endometrial curettage.  The cervical canal was further dilated with Pratt dilators to a #29.  The Novasure device was test-fired and the meter went between 4.5 and 5.  The device was withdrawn into the cylinder and placed into the uterus.  The dorsal fin was set appropriately.  The device was deployed and then seated.  The sleeve was placed up against the endocervix.  A cavity assessment test was performed and passed.  The ablation cycle was completed.  The device was removed and another hysteroscopic examination was  performed.  A good result was noted.  A Mirena IUD was inserted.  All the instruments were removed from the vagina.  Final instrument counts were correct.  The patient was taken to the PACU in stable condition.

## 2014-12-17 NOTE — Transfer of Care (Signed)
Immediate Anesthesia Transfer of Care Note  Patient: Visual merchandiser  Procedure(s) Performed: Procedure(s): DILATATION & CURETTAGE/HYSTEROSCOPY WITH NOVASURE ABLATION (N/A) INTRAUTERINE DEVICE (IUD) INSERTION (N/A) INTRAUTERINE DEVICE (IUD) REMOVAL (N/A)  Patient Location: PACU  Anesthesia Type:General  Level of Consciousness: awake, alert , oriented and patient cooperative  Airway & Oxygen Therapy: Patient Spontanous Breathing and Patient connected to nasal cannula oxygen  Post-op Assessment: Report given to PACU RN and Post -op Vital signs reviewed and stable  Post vital signs: Reviewed and stable  Complications: No apparent anesthesia complications

## 2014-12-17 NOTE — Anesthesia Postprocedure Evaluation (Signed)
  Anesthesia Post-op Note  Patient: Annette Miranda  Procedure(s) Performed: Procedure(s): DILATATION & CURETTAGE/HYSTEROSCOPY WITH NOVASURE ABLATION (N/A) INTRAUTERINE DEVICE (IUD) INSERTION (N/A) INTRAUTERINE DEVICE (IUD) REMOVAL (N/A)  Patient Location: PACU  Anesthesia Type:General  Level of Consciousness: awake, alert  and oriented  Airway and Oxygen Therapy: Patient Spontanous Breathing  Post-op Pain: mild  Post-op Assessment: Post-op Vital signs reviewed, Patient's Cardiovascular Status Stable, Respiratory Function Stable, Patent Airway, No signs of Nausea or vomiting and Pain level controlled  Post-op Vital Signs: Reviewed and stable  Last Vitals:  Filed Vitals:   12/17/14 1245  BP: 128/62  Pulse: 79  Temp:   Resp: 20    Complications: No apparent anesthesia complications

## 2014-12-17 NOTE — Anesthesia Preprocedure Evaluation (Signed)
Anesthesia Evaluation  Patient identified by MRN, date of birth, ID band Patient awake    Reviewed: Allergy & Precautions, H&P , NPO status , Patient's Chart, lab work & pertinent test results  Airway Mallampati: III  TM Distance: >3 FB Neck ROM: Full    Dental no notable dental hx. (+) Teeth Intact   Pulmonary Current Smoker,  breath sounds clear to auscultation  Pulmonary exam normal       Cardiovascular hypertension, negative cardio ROS  Rhythm:Regular Rate:Normal     Neuro/Psych  Headaches, negative psych ROS   GI/Hepatic Neg liver ROS, GERD-  Medicated and Controlled,  Endo/Other  Morbid obesity  Renal/GU negative Renal ROS  negative genitourinary   Musculoskeletal negative musculoskeletal ROS (+)   Abdominal (+) + obese,   Peds  Hematology   Anesthesia Other Findings   Reproductive/Obstetrics AUB                             Anesthesia Physical Anesthesia Plan  ASA: II  Anesthesia Plan: General   Post-op Pain Management:    Induction: Intravenous  Airway Management Planned: LMA  Additional Equipment:   Intra-op Plan:   Post-operative Plan: Extubation in OR  Informed Consent: I have reviewed the patients History and Physical, chart, labs and discussed the procedure including the risks, benefits and alternatives for the proposed anesthesia with the patient or authorized representative who has indicated his/her understanding and acceptance.   Dental advisory given  Plan Discussed with: CRNA and Anesthesiologist  Anesthesia Plan Comments:         Anesthesia Quick Evaluation

## 2014-12-17 NOTE — H&P (Signed)
  Chief Complaint: 33 y.o.  who presents with a small fibroid uterus and secondary abnormal uterine bleeding  Details of Present Illness: The patient has a several month h/o prolonged bleeding episodes refractory to a Mirena IUD, Lysteda and a therapeutic D&C.  A bleeding episode was recently arrested with high dose Premarin.  BP 146/86 mmHg  Pulse 99  Temp(Src) 97.5 F (36.4 C) (Oral)  Resp 20  Ht 5' 9.5" (1.765 m)  Wt 117.935 kg (260 lb)  BMI 37.86 kg/m2  SpO2 90%  LMP   Past Medical History  Diagnosis Date  . Pregnancy induced hypertension   . Fibroid   . Migraines    History   Social History  . Marital Status: Married    Spouse Name: N/A    Number of Children: N/A  . Years of Education: N/A   Occupational History  . Not on file.   Social History Main Topics  . Smoking status: Current Some Day Smoker -- 0.25 packs/day for 18 years    Types: Cigarettes  . Smokeless tobacco: Never Used     Comment: trying to quit  . Alcohol Use: No  . Drug Use: No  . Sexual Activity:    Partners: Male    Birth Control/ Protection: IUD   Other Topics Concern  . Not on file   Social History Narrative   Family History  Problem Relation Age of Onset  . Hypertension Mother   . Hypertension Father   . Hypertension Maternal Grandmother   . Cancer Maternal Grandmother   . Hypertension Maternal Grandfather   . Hypertension Paternal Grandmother   . Hypertension Paternal Grandfather   . Cancer Paternal Grandfather     Pertinent items are noted in HPI.  Pre-Op Diagnosis: Abnormal uterine bleeding/ Prolonged uterine bleeding   Planned Procedure: Procedure(s): DILATATION & CURETTAGE/HYSTEROSCOPY WITH NOVASURE ABLATION, MIRENA IUD INSERTION  I have reviewed the patient's history and have completed the physical exam and Madilynne Bentsen is acceptable for surgery.  Agnes Lawrence, MD 12/17/2014 10:48 AM

## 2014-12-17 NOTE — Anesthesia Procedure Notes (Signed)
Procedure Name: LMA Insertion Date/Time: 12/17/2014 11:41 AM Performed by: Stacie Glaze C Patient Re-evaluated:Patient Re-evaluated prior to inductionOxygen Delivery Method: Circle system utilized and Simple face mask Preoxygenation: Pre-oxygenation with 100% oxygen Intubation Type: IV induction Ventilation: Mask ventilation without difficulty LMA: LMA inserted LMA Size: 4.0 Grade View: Grade II Number of attempts: 1 Dental Injury: Teeth and Oropharynx as per pre-operative assessment

## 2014-12-18 ENCOUNTER — Encounter (HOSPITAL_COMMUNITY): Payer: Self-pay | Admitting: Obstetrics & Gynecology

## 2015-01-05 ENCOUNTER — Other Ambulatory Visit: Payer: Self-pay | Admitting: *Deleted

## 2015-01-05 DIAGNOSIS — B9689 Other specified bacterial agents as the cause of diseases classified elsewhere: Secondary | ICD-10-CM

## 2015-01-05 DIAGNOSIS — N760 Acute vaginitis: Principal | ICD-10-CM

## 2015-01-05 MED ORDER — DOXYCYCLINE HYCLATE 50 MG PO CAPS: 100.0000 mg | ORAL_CAPSULE | Freq: Two times a day (BID) | ORAL | Status: DC

## 2015-01-05 MED ORDER — METRONIDAZOLE 500 MG PO TABS: 500.0000 mg | ORAL_TABLET | Freq: Two times a day (BID) | ORAL | Status: DC

## 2018-05-21 ENCOUNTER — Ambulatory Visit (INDEPENDENT_AMBULATORY_CARE_PROVIDER_SITE_OTHER): Payer: No Typology Code available for payment source | Admitting: Pulmonary Disease

## 2018-05-21 ENCOUNTER — Encounter: Payer: Self-pay | Admitting: Pulmonary Disease

## 2018-05-21 VITALS — BP 130/80 | HR 92 | Ht 69.0 in | Wt 259.8 lb

## 2018-05-21 DIAGNOSIS — R0683 Snoring: Secondary | ICD-10-CM | POA: Diagnosis not present

## 2018-05-21 DIAGNOSIS — Z720 Tobacco use: Secondary | ICD-10-CM | POA: Diagnosis not present

## 2018-05-21 DIAGNOSIS — G4733 Obstructive sleep apnea (adult) (pediatric): Secondary | ICD-10-CM | POA: Diagnosis not present

## 2018-05-21 NOTE — Assessment & Plan Note (Signed)
emphasized smoking cessation.

## 2018-05-21 NOTE — Assessment & Plan Note (Signed)
Given excessive daytime somnolence, narrow pharyngeal exam, witnessed apneas & loud snoring, obstructive sleep apnea is very likely & an overnight polysomnogram will be scheduled as a home study. The pathophysiology of obstructive sleep apnea , it's cardiovascular consequences & modes of treatment including CPAP were discused with the patient in detail & they evidenced understanding.  pretest probability is high,she would be agreeable to using CPAP if absolutely needed

## 2018-05-21 NOTE — Progress Notes (Signed)
Subjective:    Patient ID: Annette Miranda, female    DOB: Jul 31, 1981, 37 y.o.   MRN: [id number]  HPI  Chief Complaint  Patient presents with  . Sleep Consult    Referred by Vicenta Aly NP for possible OSA. Denies ever having a SS before. States she wakes up fatigue in the mornings. Has some daytime sleepiness.    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 She was diagnosed with hyperthyroidism after she had her baby 3 years ago, has been maintained on methimazole and atenolol since 2018 with good control of her symptoms and TSH is back to normal.  She had a toxic multinodular goiter. She reports excessive daytime somnolence and fatigue in spite of adequate sleep quality. Loud snoring has been noted by her husband was also witnessed apneas and episodes of gasping in her sleep.  Symptoms seem to be worse when she lies on her back.  Epworth sleepiness score is 12 and she reports sleepiness while sitting and reading, watching TV, as a passenger in a car or lying down to rest in the afternoons. Bedtime is between 9 and 10 PM, sleep latency is about 50 minutes, she sleeps on her side with one pillow, reports 5-8 spontaneous nocturnal awakenings including one bathroom visit and is out of bed by 6 am feeling tired with dryness of mouth but denies headaches.  There is no history suggestive of cataplexy, sleep paralysis or parasomnias  Her weight has increased by 15 to 20 pounds over the last 2 years she admits to being overweight for a few years. She smokes about a pack per day started as a teenager, about 15 pack years She drinks alcohol socially.  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 She lives with her husband and 3 kids.  Family history of early heart disease in her maternal grandparents and one grandmother had lung cancer who smoked       Past  Medical History:  Diagnosis Date  . Daytime somnolence   . Fibroid   . Malaise   . Migraines   . Pregnancy induced hypertension    Past Surgical History:  Procedure Laterality Date  . CHOLECYSTECTOMY    . DILATION AND CURETTAGE OF UTERUS N/A 11/25/2014   Procedure: DILATATION AND CURETTAGE;  Surgeon: Lahoma Crocker, MD;  Location: [facility];  Service: Gynecology;  Laterality: N/A;  . DILITATION & CURRETTAGE/HYSTROSCOPY WITH NOVASURE ABLATION N/A 12/17/2014   Procedure: DILATATION & CURETTAGE/HYSTEROSCOPY WITH NOVASURE ABLATION;  Surgeon: Lahoma Crocker, MD;  Location: [facility];  Service: Gynecology;  Laterality: N/A;  . INTRAUTERINE DEVICE (IUD) INSERTION N/A 12/17/2014   Procedure: INTRAUTERINE DEVICE (IUD) INSERTION;  Surgeon: Lahoma Crocker, MD;  Location: [facility];  Service: Gynecology;  Laterality: N/A;  . IUD REMOVAL N/A 12/17/2014   Procedure: INTRAUTERINE DEVICE (IUD) REMOVAL;  Surgeon: Lahoma Crocker, MD;  Location: [facility];  Service: Gynecology;  Laterality: N/A;  . WISDOM TOOTH EXTRACTION N/A     No Known Allergies   Social History   Socioeconomic History  . Marital status: Married    Spouse name: Not on file  . Number of children: Not on file  . Years of education: Not on file  . Highest education level: Not on file  Occupational History  . Not on file  Social Needs  . Financial resource strain: Not on file  . Food insecurity:  Worry: Not on file    Inability: Not on file  . Transportation needs:    Medical: Not on file    Non-medical: Not on file  Tobacco Use  . Smoking status: Current Every Day Smoker    Packs/day: 0.25    Years: 18.00    Pack years: 4.50    Types: Cigarettes  . Smokeless tobacco: Never Used  . Tobacco comment: trying to quit  Substance and Sexual Activity  . Alcohol use: No    Alcohol/week: 0.0 oz  . Drug use: No  . Sexual activity: Not Currently    Partners: Male    Birth control/protection: IUD  Lifestyle  .  Physical activity:    Days per week: Not on file    Minutes per session: Not on file  . Stress: Not on file  Relationships  . Social connections:    Talks on phone: Not on file    Gets together: Not on file    Attends religious service: Not on file    Active member of club or organization: Not on file    Attends meetings of clubs or organizations: Not on file    Relationship status: Not on file  . Intimate partner violence:    Fear of current or ex partner: Not on file    Emotionally abused: Not on file    Physically abused: Not on file    Forced sexual activity: Not on file  Other Topics Concern  . Not on file  Social History Narrative  . Not on file     Family History  Problem Relation Age of Onset  . Hypertension Mother   . Hypertension Father   . Hypertension Maternal Grandmother   . Cancer Maternal Grandmother   . Hypertension Maternal Grandfather   . Hypertension Paternal Grandmother   . Hypertension Paternal Grandfather   . Cancer Paternal Grandfather      Review of Systems Positive for shortness of breath with activity, irregular heartbeat, weight gain, headaches nasal congestion, sneezing, anxiety and depression  Constitutional: negative for anorexia, fevers and sweats  Eyes: negative for irritation, redness and visual disturbance  Ears, nose, mouth, throat, and face: negative for earaches, epistaxis, nasal congestion and sore throat  Respiratory: negative for cough,  sputum and wheezing  Cardiovascular: negative for chest pain,lower extremity edema, orthopnea, palpitations and syncope  Gastrointestinal: negative for abdominal pain, constipation, diarrhea, melena, nausea and vomiting  Genitourinary:negative for dysuria, frequency and hematuria  Hematologic/lymphatic: negative for bleeding, easy bruising and lymphadenopathy  Musculoskeletal:negative for arthralgias, muscle weakness and stiff joints  Neurological: negative for coordination problems, gait  problems, headaches and weakness  Endocrine: negative for diabetic symptoms including polydipsia, polyuria and weight loss     Objective:   Physical Exam  Gen. Pleasant, obese, in no distress ENT - enlarged tonsillar tissue, no post nasal drip Neck: No JVD, no thyromegaly, no carotid bruits Lungs: no use of accessory muscles, no dullness to percussion, decreased without rales or rhonchi  Cardiovascular: Rhythm regular, heart sounds  normal, no murmurs or gallops, no peripheral edema Musculoskeletal: No deformities, no cyanosis or clubbing ,  Tremors +        Assessment & Plan:

## 2018-05-21 NOTE — Patient Instructions (Signed)
Home sleep study will be scheduled. We discussed treatment options

## 2018-05-29 DIAGNOSIS — G4733 Obstructive sleep apnea (adult) (pediatric): Secondary | ICD-10-CM | POA: Diagnosis not present

## 2018-06-03 ENCOUNTER — Telehealth: Payer: Self-pay | Admitting: Pulmonary Disease

## 2018-06-03 DIAGNOSIS — G4733 Obstructive sleep apnea (adult) (pediatric): Secondary | ICD-10-CM | POA: Diagnosis not present

## 2018-06-03 NOTE — Telephone Encounter (Signed)
Per RA, HST showed mild OSA, events increase when in supine position. Since she is very symptomatic, would suggest autocpap 5-12cm, nasal pillows and OV in 6 weeks.

## 2018-06-04 ENCOUNTER — Other Ambulatory Visit: Payer: Self-pay | Admitting: *Deleted

## 2018-06-04 DIAGNOSIS — R0683 Snoring: Secondary | ICD-10-CM

## 2018-06-10 NOTE — Telephone Encounter (Signed)
Spoke with patient. She is aware of RA recs. She wishes to proceed with the cpap machine. Explained process to the patient, she verbalized understanding. Will go ahead and place order. She has a f/u appt for 08/01/18 at 415pm with RA.   Nothing else needed at time of call.

## 2018-07-04 ENCOUNTER — Telehealth: Payer: Self-pay | Admitting: Pulmonary Disease

## 2018-07-04 NOTE — Telephone Encounter (Signed)
Chan sent order to [facility] on 6/25 & documented she received confirmation from [place] on 6/26.  Called [facility] & spoke to [place].  She can not find order.  She states she will pull from [employer] & give to [place] will be calling pt.  I called pt & made her aware Claiborne Billings from [facility] will be calling her.

## 2018-08-01 ENCOUNTER — Ambulatory Visit: Payer: No Typology Code available for payment source | Admitting: Pulmonary Disease

## 2018-11-01 ENCOUNTER — Telehealth: Payer: Self-pay | Admitting: Pulmonary Disease

## 2018-11-01 NOTE — Telephone Encounter (Signed)
Spoke with pt. She would like to have her sleep study faxed to [facility]. This has been done. Nothing further was needed.
